# Patient Record
Sex: Female | Born: 1985 | Race: Black or African American | Hispanic: No | Marital: Single | Smoking: Current every day smoker
Health system: Southern US, Community
[De-identification: ages and names within clinical notes are randomized; demographics above are authoritative.]

## PROBLEM LIST (undated history)

## (undated) ENCOUNTER — Ambulatory Visit

## (undated) DIAGNOSIS — R7303 Prediabetes: Secondary | ICD-10-CM

## (undated) DIAGNOSIS — F17200 Nicotine dependence, unspecified, uncomplicated: Secondary | ICD-10-CM

## (undated) DIAGNOSIS — J310 Chronic rhinitis: Secondary | ICD-10-CM

## (undated) DIAGNOSIS — J45909 Unspecified asthma, uncomplicated: Secondary | ICD-10-CM

## (undated) DIAGNOSIS — L309 Dermatitis, unspecified: Secondary | ICD-10-CM

## (undated) DIAGNOSIS — G8929 Other chronic pain: Secondary | ICD-10-CM

## (undated) DIAGNOSIS — F419 Anxiety disorder, unspecified: Secondary | ICD-10-CM

## (undated) DIAGNOSIS — G43909 Migraine, unspecified, not intractable, without status migrainosus: Secondary | ICD-10-CM

## (undated) DIAGNOSIS — M1990 Unspecified osteoarthritis, unspecified site: Secondary | ICD-10-CM

## (undated) DIAGNOSIS — Z6841 Body Mass Index (BMI) 40.0 and over, adult: Secondary | ICD-10-CM

## (undated) DIAGNOSIS — R0683 Snoring: Secondary | ICD-10-CM

## (undated) HISTORY — DX: Unspecified asthma, uncomplicated: J45.909

## (undated) HISTORY — DX: Other chronic pain: G89.29

## (undated) HISTORY — DX: Prediabetes: R73.03

## (undated) HISTORY — PX: TYMPANOSTOMY TUBE PLACEMENT: SHX32

## (undated) HISTORY — DX: Anxiety disorder, unspecified: F41.9

## (undated) HISTORY — DX: Body Mass Index (BMI) 40.0 and over, adult: Z684

## (undated) HISTORY — DX: Nicotine dependence, unspecified, uncomplicated: F17.200

## (undated) HISTORY — DX: Snoring: R06.83

## (undated) HISTORY — DX: Chronic rhinitis: J31.0

## (undated) HISTORY — DX: Dermatitis, unspecified: L30.9

## (undated) HISTORY — DX: Migraine, unspecified, not intractable, without status migrainosus: G43.909

---
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
## 2007-09-01 ENCOUNTER — Emergency Department (HOSPITAL_COMMUNITY): Admission: EM | Admit: 2007-09-01 | Discharge: 2007-09-01 | Payer: Self-pay | Admitting: Emergency Medicine

## 2008-10-01 ENCOUNTER — Emergency Department (HOSPITAL_COMMUNITY): Admission: EM | Admit: 2008-10-01 | Discharge: 2008-10-01 | Payer: Self-pay | Admitting: Emergency Medicine

## 2008-10-20 ENCOUNTER — Emergency Department (HOSPITAL_COMMUNITY): Admission: EM | Admit: 2008-10-20 | Discharge: 2008-10-20 | Payer: Self-pay | Admitting: Emergency Medicine

## 2008-11-20 ENCOUNTER — Ambulatory Visit (HOSPITAL_COMMUNITY): Admission: RE | Admit: 2008-11-20 | Discharge: 2008-11-20 | Payer: Self-pay | Admitting: Oncology

## 2009-04-24 ENCOUNTER — Emergency Department (HOSPITAL_COMMUNITY): Admission: EM | Admit: 2009-04-24 | Discharge: 2009-04-24 | Payer: Self-pay | Admitting: Emergency Medicine

## 2009-04-28 ENCOUNTER — Emergency Department (HOSPITAL_COMMUNITY): Admission: EM | Admit: 2009-04-28 | Discharge: 2009-04-28 | Payer: Self-pay | Admitting: Emergency Medicine

## 2009-11-17 ENCOUNTER — Emergency Department (HOSPITAL_COMMUNITY): Admission: EM | Admit: 2009-11-17 | Discharge: 2009-11-17 | Payer: Self-pay | Admitting: Family Medicine

## 2010-02-10 ENCOUNTER — Encounter: Payer: Self-pay | Admitting: Oncology

## 2010-04-20 ENCOUNTER — Emergency Department (HOSPITAL_COMMUNITY)
Admission: EM | Admit: 2010-04-20 | Discharge: 2010-04-20 | Disposition: A | Discharge status: Home / Self Care | Payer: Self-pay | Attending: Emergency Medicine | Admitting: Emergency Medicine

## 2010-04-20 DIAGNOSIS — J069 Acute upper respiratory infection, unspecified: Secondary | ICD-10-CM | POA: Insufficient documentation

## 2010-04-20 DIAGNOSIS — J45909 Unspecified asthma, uncomplicated: Secondary | ICD-10-CM | POA: Insufficient documentation

## 2010-04-20 DIAGNOSIS — R0602 Shortness of breath: Secondary | ICD-10-CM | POA: Insufficient documentation

## 2010-04-20 DIAGNOSIS — J3489 Other specified disorders of nose and nasal sinuses: Secondary | ICD-10-CM | POA: Insufficient documentation

## 2010-04-20 DIAGNOSIS — R6889 Other general symptoms and signs: Secondary | ICD-10-CM | POA: Insufficient documentation

## 2010-11-17 ENCOUNTER — Emergency Department (HOSPITAL_COMMUNITY)
Admission: EM | Admit: 2010-11-17 | Discharge: 2010-11-17 | Discharge status: Against Medical Advice | Payer: Self-pay | Attending: Emergency Medicine | Admitting: Emergency Medicine

## 2010-11-17 DIAGNOSIS — M79609 Pain in unspecified limb: Secondary | ICD-10-CM | POA: Insufficient documentation

## 2010-11-17 DIAGNOSIS — R209 Unspecified disturbances of skin sensation: Secondary | ICD-10-CM | POA: Insufficient documentation

## 2010-11-23 ENCOUNTER — Inpatient Hospital Stay (INDEPENDENT_AMBULATORY_CARE_PROVIDER_SITE_OTHER)
Admission: RE | Admit: 2010-11-23 | Discharge: 2010-11-23 | Disposition: A | Discharge status: Home / Self Care | Payer: 59 | Source: Ambulatory Visit | Attending: Family Medicine | Admitting: Family Medicine

## 2010-11-23 DIAGNOSIS — M79609 Pain in unspecified limb: Secondary | ICD-10-CM

## 2011-03-01 ENCOUNTER — Emergency Department (HOSPITAL_COMMUNITY)
Admission: EM | Admit: 2011-03-01 | Discharge: 2011-03-02 | Disposition: A | Discharge status: Home / Self Care | Payer: 59 | Attending: Emergency Medicine | Admitting: Emergency Medicine

## 2011-03-01 ENCOUNTER — Emergency Department (HOSPITAL_COMMUNITY): Payer: 59

## 2011-03-01 ENCOUNTER — Encounter (HOSPITAL_COMMUNITY): Payer: Self-pay

## 2011-03-01 DIAGNOSIS — F17200 Nicotine dependence, unspecified, uncomplicated: Secondary | ICD-10-CM | POA: Insufficient documentation

## 2011-03-01 DIAGNOSIS — R05 Cough: Secondary | ICD-10-CM | POA: Insufficient documentation

## 2011-03-01 DIAGNOSIS — J45909 Unspecified asthma, uncomplicated: Secondary | ICD-10-CM | POA: Insufficient documentation

## 2011-03-01 DIAGNOSIS — J3489 Other specified disorders of nose and nasal sinuses: Secondary | ICD-10-CM | POA: Insufficient documentation

## 2011-03-01 DIAGNOSIS — R5381 Other malaise: Secondary | ICD-10-CM | POA: Insufficient documentation

## 2011-03-01 DIAGNOSIS — R509 Fever, unspecified: Secondary | ICD-10-CM | POA: Insufficient documentation

## 2011-03-01 DIAGNOSIS — R059 Cough, unspecified: Secondary | ICD-10-CM | POA: Insufficient documentation

## 2011-03-01 HISTORY — DX: Unspecified osteoarthritis, unspecified site: M19.90

## 2011-03-01 MED ORDER — IPRATROPIUM BROMIDE 0.02 % IN SOLN
0.5000 mg | Freq: Once | RESPIRATORY_TRACT | Status: AC
  Administered 2011-03-02: 0.5 mg via RESPIRATORY_TRACT
  Filled 2011-03-01: qty 2.5

## 2011-03-01 MED ORDER — ALBUTEROL SULFATE (5 MG/ML) 0.5% IN NEBU
5.0000 mg | INHALATION_SOLUTION | Freq: Once | RESPIRATORY_TRACT | Status: AC
  Administered 2011-03-02: 5 mg via RESPIRATORY_TRACT
  Filled 2011-03-01: qty 1

## 2011-03-01 NOTE — ED Provider Notes (Signed)
History     CSN: [id number]  Arrival date & time 03/01/11  2154   First MD Initiated Contact with Patient 03/01/11 2228      Chief Complaint  Patient presents with  . Fever    HPI  History provided by the patient. Patient is a 26 year old female has history of asthma presents with complaints of cough, wheezing and nasal congestion for the past few days. She reports having been on albuterol inhaler in the past but has not used this for a long time. Symptoms began gradually and have worsened. They're described as mild to moderate. Cough is nonproductive. Symptoms are associated with some subjective fevers and chills. She has not reported any alleviating or aggravating factors. She denies any sweats, nausea, vomiting, diarrhea or constipation. Patient is otherwise healthy.    Past Medical History  Diagnosis Date  . Arthritis     History reviewed. No pertinent past surgical history.  No family history on file.  History  Substance Use Topics  . Smoking status: Current Everyday Smoker  . Smokeless tobacco: Not on file  . Alcohol Use: No    OB History    Grav Para Term Preterm Abortions TAB SAB Ect Mult Living                  Review of Systems  Constitutional: Positive for fever and chills. Negative for diaphoresis, activity change and appetite change.  HENT: Positive for congestion and rhinorrhea.   Respiratory: Positive for cough and wheezing. Negative for shortness of breath.   Cardiovascular: Negative for chest pain.  Gastrointestinal: Negative for nausea, vomiting, abdominal pain, diarrhea and constipation.    Allergies  Review of patient's allergies indicates no known allergies.  Home Medications  No current outpatient prescriptions on file.  BP 112/88  Pulse 102  Temp(Src) 98.5 F (36.9 C) (Oral)  Resp 18  SpO2 98%  LMP 02/21/2011  Physical Exam  Nursing note and vitals reviewed. Constitutional: She is oriented to person, place, and time. She appears  well-developed and well-nourished. No distress.  HENT:  Head: Normocephalic and atraumatic.  Mouth/Throat: Oropharynx is clear and moist.  Cardiovascular: Normal rate and regular rhythm.   Pulmonary/Chest: Effort normal. No respiratory distress. She has wheezes. She has no rales. She exhibits no tenderness.  Abdominal: Soft. She exhibits no distension. There is no tenderness. There is no rebound.       Obese  Neurological: She is alert and oriented to person, place, and time.  Skin: Skin is warm and dry. No rash noted.  Psychiatric: She has a normal mood and affect. Her behavior is normal.    ED Course  Procedures   Dg Chest 2 View  03/01/2011  *RADIOLOGY REPORT*  Clinical Data: Shortness of breath. Cough, cold. Weakness.  History of smoking, asthma.  CHEST - 2 VIEW  Comparison: 04/24/2009  Findings: Cardiomediastinal silhouette is within normal limits. The lungs are free of focal consolidations and pleural effusions. Visualized osseous structures have a normal appearance.  IMPRESSION: No evidence for acute  abnormality.  Original Report Authenticated By: Patterson Hammersmith, M.D.     1. Asthma       MDM  10:15 PM patient seen and evaluated. Patient in no acute distress.    She reports feeling better from breathing treatment. Chest x-ray with no significant findings. We'll provide patient with albuterol inhaler for her symptoms.   Medical screening examination/treatment/procedure(s) were performed by non-physician practitioner and as supervising physician I was immediately available  for consultation/collaboration. Osvaldo Human, M.D.   Angus Seller, PA 03/02/11 0515  Carleene Cooper III, MD 03/02/11 [phone]

## 2011-03-01 NOTE — ED Notes (Signed)
Pt states she has been having a productive cough with green mucous since yesterday.  Stated she as running a fever yesterday but couldn't tell me what her temperature was.  Pt states she has been having difficulty breathing and does use an inhaler at home and does not have one at home.

## 2011-03-01 NOTE — ED Notes (Signed)
Pt c/o possible wheezing in chest - does not have asthma meds but is suppose to be taking albuterol

## 2011-03-01 NOTE — ED Notes (Signed)
Pt c/o ear pain, nose stopped up, and SOB

## 2011-03-02 MED ORDER — ALBUTEROL SULFATE HFA 108 (90 BASE) MCG/ACT IN AERS
2.0000 | INHALATION_SPRAY | RESPIRATORY_TRACT | Status: DC | PRN
  Administered 2011-03-02: 2 via RESPIRATORY_TRACT
  Filled 2011-03-02: qty 6.7

## 2011-12-13 ENCOUNTER — Emergency Department (INDEPENDENT_AMBULATORY_CARE_PROVIDER_SITE_OTHER)
Admission: EM | Admit: 2011-12-13 | Discharge: 2011-12-13 | Disposition: A | Discharge status: Home / Self Care | Payer: 59 | Source: Home / Self Care

## 2011-12-13 ENCOUNTER — Encounter (HOSPITAL_COMMUNITY): Payer: Self-pay | Admitting: Emergency Medicine

## 2011-12-13 DIAGNOSIS — J069 Acute upper respiratory infection, unspecified: Secondary | ICD-10-CM

## 2011-12-13 DIAGNOSIS — H6590 Unspecified nonsuppurative otitis media, unspecified ear: Secondary | ICD-10-CM

## 2011-12-13 HISTORY — DX: Unspecified asthma, uncomplicated: J45.909

## 2011-12-13 MED ORDER — AMOXICILLIN 500 MG PO CAPS: 500.0000 mg | ORAL_CAPSULE | Freq: Two times a day (BID) | ORAL | Status: DC

## 2011-12-13 NOTE — ED Notes (Signed)
Reports fever and sore throat for five days.  Reports she felt as if she was going to pass out at work on Tuesday.  Patient having chills and sweat.  Patient has tried many OTC medications but no relief.  Patient reports she does have left ear pain for five days also.  Patient states it started as a cold at first.

## 2011-12-13 NOTE — ED Provider Notes (Signed)
Karen Jensen  CC:  Sore throat, chills, ear pains  HPI:  Pt is a 26 y.o. with 5 days chills/fever, sore throat started next day. Runny nose originally now congested. Had cold 3 weeks ago. Cough still there, not coughing much up. Wheezing. Has been taking cough drops, cough medicine, allergy medicine (allavert?). Has asthma, using inhaler once in a while. Used last night.   Past Medical History  Diagnosis Date  . Asthma    PSH:  None  SOC:  Smokes 5-6 cig a day. Occ EtOH. Marijuana - stopped recently.  FAM:  None known  MEDS: none except OTC listed above  NKDA  PHYS EXAM Filed Vitals:   12/13/11 0935  BP: 112/67  Pulse: 114  Temp: 98.5 F (36.9 C)  Resp: 16   GEN:  Obese, NAD HEENT:  NCAT, conjunctiva muddy, EOMI, PERRL EARS:  Left TM erythema, bubbles, bulging, landmarks obscured but light reflex present, some sclerosis.  Right:  Sclerosis, light reflex present, landmarks obscured with bulging TM.  OP:  Red, no exudate Neck:  Minor ant cerv adenopathy CV:  RRR, no murmurs RESP:  CTAB Skin:  WWP  LABS:  Rapid Strep negative  A/P:  URI with sinusitis/otitis media Amoxicillin Supportive care - decongestants, mucinex, sinus rinse  Napoleon Form, MD 12/13/2011 10:04 AM     Napoleon Form, MD 12/13/11 1004

## 2011-12-22 ENCOUNTER — Emergency Department (HOSPITAL_COMMUNITY)
Admission: EM | Admit: 2011-12-22 | Discharge: 2011-12-22 | Disposition: A | Discharge status: Home / Self Care | Payer: 59 | Attending: Emergency Medicine | Admitting: Emergency Medicine

## 2011-12-22 ENCOUNTER — Encounter (HOSPITAL_COMMUNITY): Payer: Self-pay | Admitting: Emergency Medicine

## 2011-12-22 DIAGNOSIS — R1310 Dysphagia, unspecified: Secondary | ICD-10-CM | POA: Insufficient documentation

## 2011-12-22 DIAGNOSIS — J3489 Other specified disorders of nose and nasal sinuses: Secondary | ICD-10-CM | POA: Insufficient documentation

## 2011-12-22 DIAGNOSIS — H9209 Otalgia, unspecified ear: Secondary | ICD-10-CM | POA: Insufficient documentation

## 2011-12-22 DIAGNOSIS — J069 Acute upper respiratory infection, unspecified: Secondary | ICD-10-CM

## 2011-12-22 DIAGNOSIS — J029 Acute pharyngitis, unspecified: Secondary | ICD-10-CM

## 2011-12-22 DIAGNOSIS — Z8739 Personal history of other diseases of the musculoskeletal system and connective tissue: Secondary | ICD-10-CM | POA: Insufficient documentation

## 2011-12-22 DIAGNOSIS — R5381 Other malaise: Secondary | ICD-10-CM | POA: Insufficient documentation

## 2011-12-22 DIAGNOSIS — R599 Enlarged lymph nodes, unspecified: Secondary | ICD-10-CM | POA: Insufficient documentation

## 2011-12-22 DIAGNOSIS — R05 Cough: Secondary | ICD-10-CM | POA: Insufficient documentation

## 2011-12-22 DIAGNOSIS — F17200 Nicotine dependence, unspecified, uncomplicated: Secondary | ICD-10-CM | POA: Insufficient documentation

## 2011-12-22 DIAGNOSIS — R059 Cough, unspecified: Secondary | ICD-10-CM | POA: Insufficient documentation

## 2011-12-22 DIAGNOSIS — Z3202 Encounter for pregnancy test, result negative: Secondary | ICD-10-CM | POA: Insufficient documentation

## 2011-12-22 DIAGNOSIS — N760 Acute vaginitis: Secondary | ICD-10-CM

## 2011-12-22 LAB — URINALYSIS, ROUTINE W REFLEX MICROSCOPIC
Bilirubin Urine: NEGATIVE
Glucose, UA: NEGATIVE mg/dL
Ketones, ur: NEGATIVE mg/dL
Leukocytes, UA: NEGATIVE
Protein, ur: NEGATIVE mg/dL
Specific Gravity, Urine: 1.012 (ref 1.005–1.030)
Urobilinogen, UA: 1 mg/dL (ref 0.0–1.0)
pH: 5.5 (ref 5.0–8.0)

## 2011-12-22 LAB — POCT PREGNANCY, URINE: Preg Test, Ur: NEGATIVE

## 2011-12-22 MED ORDER — HYDROCOD POLST-CHLORPHEN POLST 10-8 MG/5ML PO LQCR: 5.0000 mL | Freq: Two times a day (BID) | ORAL | Status: DC

## 2011-12-22 MED ORDER — FLUCONAZOLE 200 MG PO TABS: 200.0000 mg | ORAL_TABLET | Freq: Every day | ORAL | Status: DC

## 2011-12-22 MED ORDER — PREDNISONE 20 MG PO TABS: ORAL_TABLET | ORAL | Status: DC

## 2011-12-22 MED ORDER — AZITHROMYCIN 250 MG PO TABS: ORAL_TABLET | ORAL | Status: DC

## 2011-12-22 NOTE — ED Notes (Signed)
Patient reports having been seen at urgent care for URI. Patient states today is last dose of Amoxicillin, patient states symptoms overall have worsened, although mucous has cleared. Patient also states she thinks she may have a yeast infection. Patient states increased symptoms, with difficulty swallowing and difficulty sleeping is what brings her in this morning.

## 2011-12-22 NOTE — ED Provider Notes (Signed)
History     CSN: [id number]  Arrival date & time 12/22/11  0608   First MD Initiated Contact with Patient 12/22/11 [phone]      Chief Complaint  Patient presents with  . Sore Throat  . Vaginitis    (Consider location/radiation/quality/duration/timing/severity/associated sxs/prior treatment) HPI Comments: Karen Jensen 26 y.o. female   The chief complaint is: Patient presents with:   Sore Throat   Vaginitis    26 year old female presents to emergency department with chief complaint of sore throat and vaginitis.  Patient states that she was seen at an urgent care on November 24.  She had a negative rapid strep at that time.  She was given amoxicillin and has taken her medication as prescribed.  She states that about 3 days into the course of her antibiotic she began having intense vaginal itching.  Patient also has sore throat with  associated tender cervical lymphadenopathy, otalgia bilaterally, sinus congestion, odynophagia.  She denies shortness of breath or difficulty breathing. Denies fevers, chills, myalgias, arthralgias. Denies DOE, SOB, chest tightness or pressure, radiation to left arm, jaw or back, or diaphoresis. Denies dysuria, flank pain, suprapubic pain, frequency, urgency, or hematuria. Denies headaches, light headedness, weakness, visual disturbances. Denies abdominal pain, nausea, vomiting, diarrhea or constipation.     Patient is a 26 y.o. female presenting with pharyngitis. The history is provided by the patient. No language interpreter was used.  Sore Throat The current episode started 1 to 4 weeks ago. The problem occurs constantly. The problem has been unchanged. Associated symptoms include congestion, coughing, fatigue, a sore throat and swollen glands. Pertinent negatives include no abdominal pain, anorexia, arthralgias, change in bowel habit, chest pain, chills, diaphoresis, fever, headaches, joint swelling, myalgias, nausea, neck pain, numbness, rash,  urinary symptoms, vertigo, visual change, vomiting or weakness. Associated symptoms comments: Vaginal pruritus .    Past Medical History  Diagnosis Date  . Arthritis     History reviewed. No pertinent past surgical history.  History reviewed. No pertinent family history.  History  Substance Use Topics  . Smoking status: Current Every Day Smoker  . Smokeless tobacco: Not on file  . Alcohol Use: No    OB History    Grav Para Term Preterm Abortions TAB SAB Ect Mult Living                  Review of Systems  Constitutional: Positive for fatigue. Negative for fever, chills and diaphoresis.  HENT: Positive for ear pain, congestion and sore throat. Negative for hearing loss, neck pain, neck stiffness, tinnitus and ear discharge.   Respiratory: Positive for cough.   Cardiovascular: Negative for chest pain.  Gastrointestinal: Negative for nausea, vomiting, abdominal pain, anorexia and change in bowel habit.  Musculoskeletal: Negative for myalgias, joint swelling and arthralgias.  Skin: Negative for rash.  Neurological: Negative for vertigo, weakness, numbness and headaches.  All other systems reviewed and are negative.  All else negative except as stated in HPI.   Allergies  Review of patient's allergies indicates no known allergies.  Home Medications   Current Outpatient Rx  Name  Route  Sig  Dispense  Refill  . AMOXICILLIN 500 MG PO CAPS   Oral   Take 500 mg by mouth 2 (two) times daily.           BP 115/87  Pulse 100  Temp 98.1 F (36.7 C) (Oral)  Resp 20  Ht 5\' 1"  (1.549 m)  Wt 195 lb (88.451 kg)  BMI  36.84 kg/m2  SpO2 100%  LMP 11/17/2011  Physical Exam Physical Exam  Nursing note and vitals reviewed. Constitutional: She is oriented to person, place, and time. She appears well-developed and well-nourished. No distress.  HENT:  Head: Normocephalic and atraumatic.  Eyes: Conjunctivae normal and EOM are normal. Pupils are equal, round, and reactive to  light. No scleral icterus.  Neck: Normal range of motion.  Tender Anterior/Tonsilar cervical Lymphadenopathy. Mouth/throat: Pharyngeal erythema present.  Uvula is midline.  Airway is patent.  Exudates are present on the uvula and tonsils.  Cobblestoning of the posterior pharynx consistent with postnasal drip.  There is some tonsillar and uvular swelling present. Ears: Scarring on the inferior aspect of the TMs bilaterally.  Both have retraction of the membrane.  No fluid levels present. Cardiovascular: Normal rate, regular rhythm and normal heart sounds.  Exam reveals no gallop and no friction rub.   No murmur heard. Pulmonary/Chest: Effort normal and breath sounds normal. No respiratory distress.  Abdominal: Soft. Bowel sounds are normal. She exhibits no distension and no mass. There is no tenderness. There is no guarding.  Neurological: She is alert and oriented to person, place, and time.  Skin: Skin is warm and dry. She is not diaphoretic.    ED Course  Procedures (including critical care time)  Labs Reviewed  URINALYSIS, ROUTINE W REFLEX MICROSCOPIC - Abnormal; Notable for the following:    APPearance CLOUDY (*)     All other components within normal limits  POCT PREGNANCY, URINE   No results found.   1. URI (upper respiratory infection)   2. Pharyngitis   3. Vaginitis       MDM  . Filed Vitals:   12/22/11 0617 12/22/11 0722  BP: 115/87 107/73  Pulse: 100 89  Temp: 98.1 F (36.7 C)   TempSrc: Oral   Resp: 20 18  Height: 5\' 1"  (1.549 m)   Weight: 195 lb (88.451 kg)   SpO2: 100% 100%   Patient with otalgia and retraction that likley reflects viral process, however given length of illness and I will change her abx,  Vaginits is described as intensely pruritic with thick curd-like dc consistent with yeast. Patient is low risk for STI as she is FSF and has not had sex in several months. D/c with zpack and diflucan plus sxs relief.  Discussed return precautions and need  for pelvic exam if sxs should not resolve. Discussed reasons to seek immediate care. Patient expresses understanding and agrees with plan.         Arthor Captain, PA-C 12/24/11 [phone]

## 2011-12-22 NOTE — ED Notes (Signed)
Patient was seen 11/21 for URI, sore throat, and left ear pain.  Patient now c/o bilateral ear pain, sore throat, and yeast infection.  Patient denies fevers.  Patient is on Amoxicillin PO and is on her last day.

## 2011-12-24 NOTE — ED Provider Notes (Signed)
Medical screening examination/treatment/procedure(s) were performed by non-physician practitioner and as supervising physician I was immediately available for consultation/collaboration.  Derwood Kaplan, MD 12/24/11 [phone]

## 2012-07-05 ENCOUNTER — Encounter (HOSPITAL_COMMUNITY): Payer: Self-pay | Admitting: Emergency Medicine

## 2012-07-05 ENCOUNTER — Emergency Department (INDEPENDENT_AMBULATORY_CARE_PROVIDER_SITE_OTHER)
Admission: EM | Admit: 2012-07-05 | Discharge: 2012-07-05 | Disposition: A | Discharge status: Home / Self Care | Payer: 59 | Source: Home / Self Care

## 2012-07-05 DIAGNOSIS — L259 Unspecified contact dermatitis, unspecified cause: Secondary | ICD-10-CM

## 2012-07-05 DIAGNOSIS — G5603 Carpal tunnel syndrome, bilateral upper limbs: Secondary | ICD-10-CM

## 2012-07-05 DIAGNOSIS — G5600 Carpal tunnel syndrome, unspecified upper limb: Secondary | ICD-10-CM

## 2012-07-05 DIAGNOSIS — L309 Dermatitis, unspecified: Secondary | ICD-10-CM

## 2012-07-05 MED ORDER — DICLOFENAC POTASSIUM 50 MG PO TABS: 50.0000 mg | ORAL_TABLET | Freq: Three times a day (TID) | ORAL | Status: DC

## 2012-07-05 MED ORDER — TRIAMCINOLONE ACETONIDE 0.1 % EX CREA: TOPICAL_CREAM | Freq: Two times a day (BID) | CUTANEOUS | Status: DC

## 2012-07-05 NOTE — ED Notes (Signed)
Pt c/o eczema of right ring finger onset 1 yr and numbness of right hand and left hand onset today sxs include: intermittent sharp pains that radiate to right shoulder... Hx of carpal tunnel of right hand Works at [employer] and constantly using hands... Denies: inj/trauma, fevers... She is alert and oriented w/no signs of acute distress.

## 2012-07-05 NOTE — ED Provider Notes (Signed)
History     CSN: [id number]  Arrival date & time 07/05/12  1232   None     Chief Complaint  Patient presents with  . Eczema  . Hand Pain    (Consider location/radiation/quality/duration/timing/severity/associated sxs/prior treatment) HPI Comments: 27 year old female presents with complaints of pain in the wrist and hands with numbness and diminished use. She states she was diagnosed with carpal tunnel syndrome several months ago and was given a wrist splint. She lost the wrist splint. 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 Is also complaining of rash on the right long finger and describes it as recurrent eczema. Hydrocortisone help some.   Past Medical History  Diagnosis Date  . Asthma     History reviewed. No pertinent past surgical history.  No family history on file.  History  Substance Use Topics  . Smoking status: Current Every Day Smoker -- 0.50 packs/day    Types: Cigarettes  . Smokeless tobacco: Not on file  . Alcohol Use: Yes    OB History   Grav Para Term Preterm Abortions TAB SAB Ect Mult Living                  Review of Systems  Constitutional: Negative for fever, chills and activity change.  HENT: Negative.   Respiratory: Negative.   Cardiovascular: Negative.   Musculoskeletal:       As per HPI  Skin: Negative for color change, pallor and rash.  Neurological: Positive for numbness.    Allergies  Review of patient's allergies indicates no known allergies.  Home Medications   Current Outpatient Rx  Name  Route  Sig  Dispense  Refill  . albuterol (PROVENTIL HFA;VENTOLIN HFA) 108 (90 BASE) MCG/ACT inhaler   Inhalation   Inhale 2 puffs into the lungs every 6 (six) hours as needed.         Marland Kitchen amoxicillin (AMOXIL) 500 MG capsule   Oral   Take 1 capsule (500 mg total) by mouth 2 (two) times daily.   20 capsule   2   .  diclofenac (CATAFLAM) 50 MG tablet   Oral   Take 1 tablet (50 mg total) by mouth 3 (three) times daily.   25 tablet   0   . triamcinolone cream (KENALOG) 0.1 %   Topical   Apply topically 2 (two) times daily. Apply for 2 weeks. May use on face   30 g   0     BP 118/89  Pulse 84  Temp(Src) 98.4 F (36.9 C) (Oral)  Resp 16  SpO2 100%  LMP 05/20/2012  Physical Exam  Nursing note and vitals reviewed. Constitutional: She is oriented to person, place, and time. She appears well-developed and well-nourished. No distress.  HENT:  Head: Normocephalic and atraumatic.  Eyes: EOM are normal.  Neck: Normal range of motion. Neck supple.  Musculoskeletal:  Some tenderness in the right hand and digits. Positive Phalen's and positive Tinel's. Complaining of numbness primarily to the areas distributed by the median nerve bilaterally. She is able to grasp and flex and extend the digits but with some discomfort in slowness of movement. Radial pulses 2+. Distal warmth is intact. Capillary refill is brisk.  Lymphadenopathy:    She has no cervical adenopathy.  Neurological: She is alert and oriented to person, place, and time. No cranial nerve deficit. She exhibits normal muscle tone.  Skin: Skin is warm and dry.  Psychiatric: She has a normal mood and affect.    ED Course  Procedures (including critical care time)  Labs Reviewed - No data to display No results found.   1. Carpal tunnel syndrome, bilateral   2. Eczema of hand       MDM  The patient his been doing the same activity since her initial visit for carpal tunnel syndrome. Her wrists and hands are getting worse with the symptoms listed above. Treat with the wrist splints to wear at all times particularly at work. Cataflam 50 mg every 8 hours when necessary pain, take with food Must followup with the orthopedic surgeon as this is getting worse. Call for appointment.        Hayden Rasmussen, NP 07/05/12 1427

## 2012-07-05 NOTE — ED Provider Notes (Signed)
Medical screening examination/treatment/procedure(s) were performed by non-physician practitioner and as supervising physician I was immediately available for consultation/collaboration.  Leslee Home, M.D.  Reuben Likes, MD 07/05/12 [phone]

## 2012-11-24 ENCOUNTER — Emergency Department (HOSPITAL_COMMUNITY)
Admission: EM | Admit: 2012-11-24 | Discharge: 2012-11-24 | Disposition: A | Discharge status: Home / Self Care | Payer: 59 | Attending: Emergency Medicine | Admitting: Emergency Medicine

## 2012-11-24 ENCOUNTER — Encounter (HOSPITAL_COMMUNITY): Payer: Self-pay | Admitting: Emergency Medicine

## 2012-11-24 DIAGNOSIS — J019 Acute sinusitis, unspecified: Secondary | ICD-10-CM | POA: Insufficient documentation

## 2012-11-24 DIAGNOSIS — Z79899 Other long term (current) drug therapy: Secondary | ICD-10-CM | POA: Insufficient documentation

## 2012-11-24 DIAGNOSIS — H9209 Otalgia, unspecified ear: Secondary | ICD-10-CM | POA: Insufficient documentation

## 2012-11-24 DIAGNOSIS — J329 Chronic sinusitis, unspecified: Secondary | ICD-10-CM

## 2012-11-24 DIAGNOSIS — F17200 Nicotine dependence, unspecified, uncomplicated: Secondary | ICD-10-CM | POA: Insufficient documentation

## 2012-11-24 DIAGNOSIS — K0889 Other specified disorders of teeth and supporting structures: Secondary | ICD-10-CM

## 2012-11-24 MED ORDER — AMOXICILLIN 500 MG PO CAPS: 500.0000 mg | ORAL_CAPSULE | Freq: Three times a day (TID) | ORAL | Status: DC

## 2012-11-24 MED ORDER — PREDNISONE 20 MG PO TABS: 40.0000 mg | ORAL_TABLET | Freq: Every day | ORAL | Status: DC

## 2012-11-24 MED ORDER — FLUTICASONE PROPIONATE 50 MCG/ACT NA SUSP: 2.0000 | Freq: Every day | NASAL | Status: DC

## 2012-11-24 MED ORDER — FLUCONAZOLE 200 MG PO TABS: 200.0000 mg | ORAL_TABLET | Freq: Once | ORAL | Status: DC

## 2012-11-24 MED ORDER — HYDROCODONE-ACETAMINOPHEN 5-325 MG PO TABS: ORAL_TABLET | ORAL | Status: DC

## 2012-11-24 MED ORDER — ALBUTEROL SULFATE HFA 108 (90 BASE) MCG/ACT IN AERS: 1.0000 | INHALATION_SPRAY | RESPIRATORY_TRACT | Status: DC | PRN

## 2012-11-24 MED ORDER — LORAZEPAM 2 MG/ML IJ SOLN: 2.0000 mg | Freq: Once | INTRAMUSCULAR | Status: DC

## 2012-11-24 NOTE — ED Provider Notes (Signed)
CSN: [id number]     Arrival date & time 11/24/12  1030 History   First MD Initiated Contact with Patient 11/24/12 1040     Chief Complaint  Patient presents with  . Dental Pain   (Consider location/radiation/quality/duration/timing/severity/associated sxs/prior Treatment) HPI  Karen Jensen is a 27 y.o. female past medical history significant for asthma, no history of intubations or hospitalizations, complaining of left upper tooth pain associated with left ear pain, sinus pressure and congestion worsening over the course of 3 weeks. Patient has been using Anbesol at home with little relief. Patient denies fever, nausea vomiting, cough, shortness of breath. Patient is requesting inhaler because when she is sick she states her asthma is exacerbated.  Past Medical History  Diagnosis Date  . Arthritis    History reviewed. No pertinent past surgical history. No family history on file. History  Substance Use Topics  . Smoking status: Current Every Day Smoker -- 0.25 packs/day    Types: Cigarettes  . Smokeless tobacco: Not on file  . Alcohol Use: No   OB History   Grav Para Term Preterm Abortions TAB SAB Ect Mult Living                 Review of Systems 10 systems reviewed and found to be negative, except as noted in the HPI   Allergies  Review of patient's allergies indicates no known allergies.  Home Medications   Current Outpatient Rx  Name  Route  Sig  Dispense  Refill  . benzocaine (HURRICAINE) 20 % GEL   Mouth/Throat   Use as directed 1 application in the mouth or throat every 30 (thirty) minutes as needed (pain).         Marland Kitchen gabapentin (NEURONTIN) 300 MG capsule   Oral   Take 300 mg by mouth 3 (three) times daily as needed (carpol tunnel pain).         Marland Kitchen ibuprofen (ADVIL,MOTRIN) 200 MG tablet   Oral   Take 1,600 mg by mouth every 6 (six) hours as needed for moderate pain.         . pseudoephedrine-guaifenesin (TUSSIN PE) 30-100 MG/5ML SYRP   Oral   Take  5 mLs by mouth every 4 (four) hours as needed (cough).          BP 154/104  Pulse 81  Temp(Src) 98.2 F (36.8 C) (Oral)  Resp 18  SpO2 100% Physical Exam  Nursing note and vitals reviewed. Constitutional: She is oriented to person, place, and time. She appears well-developed and well-nourished. No distress.  HENT:  Head: Normocephalic.  Mouth/Throat: Oropharynx is clear and moist.  Patient has severe tenderness to palpation of bilateral maxillary and frontal sinuses, nasal turbinates are erythematous and swollen, posterior pharynx is mildly injected. Bilateral tympanic membranes with scarring from prior tube placement, slightly bulging.   Generally good dentition, no gingival swelling, erythema or tenderness to palpation. Patient is handling their secretions. There is no tenderness to palpation or firmness underneath tongue bilaterally. No trismus.    Eyes: Conjunctivae and EOM are normal. Pupils are equal, round, and reactive to light.  Neck: Normal range of motion. Neck supple.  Cardiovascular: Normal rate, regular rhythm and intact distal pulses.   Pulmonary/Chest: Effort normal and breath sounds normal. No stridor. No respiratory distress. She has no wheezes. She has no rales. She exhibits no tenderness.  Abdominal: Soft. Bowel sounds are normal. She exhibits no distension and no mass. There is no tenderness. There is no rebound and no  guarding.  Musculoskeletal: Normal range of motion.  Neurological: She is alert and oriented to person, place, and time.  Psychiatric: She has a normal mood and affect.    ED Course  Procedures (including critical care time) Labs Review Labs Reviewed - No data to display Imaging Review No results found.  EKG Interpretation   None       MDM  No diagnosis found.   Filed Vitals:   11/24/12 1053  BP: 154/104  Pulse: 81  Temp: 98.2 F (36.8 C)  TempSrc: Oral  Resp: 18  SpO2: 100%     Karen Jensen is a 27 y.o. female with 3  weeks of worsening right-sided maxillary pain. The pain is likely coming from a sinusitis as the dental exam shows no abnormalities. Patient will be treated with 10 days of amoxicillin, encouraged to use fluticasone and nasal saline, I will ask her to follow with a dentist for a full evaluation. Patient has requested albuterol inhaler and yeast infection treatment which she states she always gets when she has any antibiotics. I have encouraged her to complete the antibiotic course of stay well-hydrated. Return precautions were discussed.  Pt is hemodynamically stable, appropriate for, and amenable to discharge at this time. Pt verbalized understanding and agrees with care plan. All questions answered. Outpatient follow-up and specific return precautions discussed.    New Prescriptions   ALBUTEROL (PROVENTIL HFA;VENTOLIN HFA) 108 (90 BASE) MCG/ACT INHALER    Inhale 1-2 puffs into the lungs every 4 (four) hours as needed for wheezing or shortness of breath.   AMOXICILLIN (AMOXIL) 500 MG CAPSULE    Take 1 capsule (500 mg total) by mouth 3 (three) times daily.   FLUCONAZOLE (DIFLUCAN) 200 MG TABLET    Take 1 tablet (200 mg total) by mouth once.   FLUTICASONE (FLONASE) 50 MCG/ACT NASAL SPRAY    Place 2 sprays into both nostrils daily.   HYDROCODONE-ACETAMINOPHEN (NORCO/VICODIN) 5-325 MG PER TABLET    Take 1-2 tablets by mouth every 6 hours as needed for pain.   PREDNISONE (DELTASONE) 20 MG TABLET    Take 2 tablets (40 mg total) by mouth daily.    Note: Portions of this report may have been transcribed using voice recognition software. Every effort was made to ensure accuracy; however, inadvertent computerized transcription errors may be present      Wynetta Emery, PA-C 11/24/12 1116

## 2012-11-24 NOTE — ED Notes (Addendum)
Per pt has had tooth pain x3 wks to "left upper wisdom tooth." Pt states pain has worsened and now experiencing left ear pain.

## 2012-11-24 NOTE — ED Provider Notes (Signed)
Medical screening examination/treatment/procedure(s) were performed by non-physician practitioner and as supervising physician I was immediately available for consultation/collaboration.  EKG Interpretation   None         Junius Argyle, MD 11/24/12 1610

## 2013-07-10 ENCOUNTER — Ambulatory Visit (INDEPENDENT_AMBULATORY_CARE_PROVIDER_SITE_OTHER): Payer: BC Managed Care – PPO | Admitting: Emergency Medicine

## 2013-07-10 VITALS — BP 110/66 | HR 88 | Temp 98.2°F | Resp 20 | Ht 60.0 in | Wt 199.8 lb

## 2013-07-10 DIAGNOSIS — G5601 Carpal tunnel syndrome, right upper limb: Secondary | ICD-10-CM

## 2013-07-10 DIAGNOSIS — G5600 Carpal tunnel syndrome, unspecified upper limb: Secondary | ICD-10-CM

## 2013-07-10 MED ORDER — NAPROXEN SODIUM 550 MG PO TABS: 550.0000 mg | ORAL_TABLET | Freq: Two times a day (BID) | ORAL | Status: DC

## 2013-07-10 NOTE — Progress Notes (Signed)
[facility] [address], [place] [address] [phone]  Date:  07/10/2013   Name:  Karen SaleQuenyatta Beason   DOB:  05/19/1985   MRN:  [id number]  PCP:  No PCP Per Patient    Chief Complaint: Hand Pain   History of Present Illness:  Karen SaleQuenyatta Neu is a 28 y.o. very pleasant female patient who presents with the following:  History of carpal tunnel syndrome in right hand.  Wore a splint at night but lost it in a move.  Was treated previously with medication but did not improve. Has numbness in median nerve distribution.  No improvement with over the counter medications or other home remedies. Denies other complaint or health concern today.   There are no active problems to display for this patient.   Past Medical History  Diagnosis Date  . Arthritis     No past surgical history on file.  History  Substance Use Topics  . Smoking status: Current Every Day Smoker -- 0.25 packs/day    Types: Cigarettes  . Smokeless tobacco: Not on file  . Alcohol Use: No    No family history on file.  No Known Allergies  Medication list has been reviewed and updated.  Current Outpatient Prescriptions on File Prior to Visit  Medication Sig Dispense Refill  . albuterol (PROVENTIL HFA;VENTOLIN HFA) 108 (90 BASE) MCG/ACT inhaler Inhale 1-2 puffs into the lungs every 4 (four) hours as needed for wheezing or shortness of breath.  1 Inhaler  3  . benzocaine (HURRICAINE) 20 % GEL Use as directed 1 application in the mouth or throat every 30 (thirty) minutes as needed (pain).      . fluticasone (FLONASE) 50 MCG/ACT nasal spray Place 2 sprays into both nostrils daily.  16 g  0  . gabapentin (NEURONTIN) 300 MG capsule Take 300 mg by mouth 3 (three) times daily as needed (carpol tunnel pain).      Marland Kitchen. HYDROcodone-acetaminophen (NORCO/VICODIN) 5-325 MG per tablet Take 1-2 tablets by mouth every 6 hours as needed for pain.  15 tablet  0  . ibuprofen (ADVIL,MOTRIN) 200 MG tablet Take  1,600 mg by mouth every 6 (six) hours as needed for moderate pain.      . pseudoephedrine-guaifenesin (TUSSIN PE) 30-100 MG/5ML SYRP Take 5 mLs by mouth every 4 (four) hours as needed (cough).       No current facility-administered medications on file prior to visit.    Review of Systems:  As per HPI, otherwise negative.    Physical Examination: Filed Vitals:   07/10/13 1512  BP: 110/66  Pulse: 88  Temp: 98.2 F (36.8 C)  Resp: 20   Filed Vitals:   07/10/13 1512  Height: 5' (1.524 m)  Weight: 199 lb 12.8 oz (90.629 kg)   Body mass index is 39.02 kg/(m^2). Ideal Body Weight: Weight in (lb) to have BMI = 25: 127.7   GEN: WDWN, NAD, Non-toxic, Alert & Oriented x 3 HEENT: Atraumatic, Normocephalic.  Ears and Nose: No external deformity. EXTR: No clubbing/cyanosis/edema NEURO: Normal gait.  PSYCH: Normally interactive. Conversant. Not depressed or anxious appearing.  Calm demeanor.  RIGHT hand Positive phalen and tinnel's   No thenar wasting.    Assessment and Plan: Carpal tunnel syndrome right hand  Signed,  Phillips OdorJeffery Anderson, MD

## 2013-07-10 NOTE — Patient Instructions (Signed)
Carpal Tunnel Syndrome The carpal tunnel is a narrow area located on the palm side of your wrist. The tunnel is formed by the wrist bones and ligaments. Nerves, blood vessels, and tendons pass through the carpal tunnel. Repeated wrist motion or certain diseases may cause swelling within the tunnel. This swelling pinches the main nerve in the wrist (median nerve) and causes the painful hand and arm condition called carpal tunnel syndrome. CAUSES   Repeated wrist motions.  Wrist injuries.  Certain diseases like arthritis, diabetes, alcoholism, hyperthyroidism, and kidney failure.  Obesity.  Pregnancy. SYMPTOMS   A "pins and needles" feeling in your fingers or hand.  Tingling or numbness in your fingers or hand.  An aching feeling in your entire arm.  Wrist pain that goes up your arm to your shoulder.  Pain that goes down into your palm or fingers.  A weak feeling in your hands. DIAGNOSIS  Your caregiver will take your history and perform a physical exam. An electromyography test may be needed. This test measures electrical signals sent out by the muscles. The electrical signals are usually slowed by carpal tunnel syndrome. You may also need X-rays. TREATMENT  Carpal tunnel syndrome may clear up by itself. Your caregiver may recommend a wrist splint or medicine such as a nonsteroidal anti-inflammatory medicine. Cortisone injections may help. Sometimes, surgery may be needed to free the pinched nerve.  HOME CARE INSTRUCTIONS   Take all medicine as directed by your caregiver. Only take over-the-counter or prescription medicines for pain, discomfort, or fever as directed by your caregiver.  If you were given a splint to keep your wrist from bending, wear it as directed. It is important to wear the splint at night. Wear the splint for as long as you have pain or numbness in your hand, arm, or wrist. This may take 1 to 2 months.  Rest your wrist from any activity that may be causing your  pain. If your symptoms are work-related, you may need to talk to your employer about changing to a job that does not require using your wrist.  Put ice on your wrist after long periods of wrist activity.  Put ice in a plastic bag.  Place a towel between your skin and the bag.  Leave the ice on for 15-20 minutes, 03-04 times a day.  Keep all follow-up visits as directed by your caregiver. This includes any orthopedic referrals, physical therapy, and rehabilitation. Any delay in getting necessary care could result in a delay or failure of your condition to heal. SEEK IMMEDIATE MEDICAL CARE IF:   You have new, unexplained symptoms.  Your symptoms get worse and are not helped or controlled with medicines. MAKE SURE YOU:   Understand these instructions.  Will watch your condition.  Will get help right away if you are not doing well or get worse. Document Released: 01/04/2000 Document Revised: 03/31/2011 Document Reviewed: 11/22/2010 ExitCare Patient Information 2015 [employer]. This information is not intended to replace advice given to you by your health care provider. Make sure you discuss any questions you have with your health care provider.  

## 2013-08-07 ENCOUNTER — Ambulatory Visit (INDEPENDENT_AMBULATORY_CARE_PROVIDER_SITE_OTHER): Payer: BC Managed Care – PPO | Admitting: Family Medicine

## 2013-08-07 VITALS — BP 110/72 | HR 91 | Temp 99.1°F | Resp 18 | Ht 60.0 in | Wt 196.0 lb

## 2013-08-07 DIAGNOSIS — F17200 Nicotine dependence, unspecified, uncomplicated: Secondary | ICD-10-CM

## 2013-08-07 DIAGNOSIS — L209 Atopic dermatitis, unspecified: Secondary | ICD-10-CM

## 2013-08-07 DIAGNOSIS — J4540 Moderate persistent asthma, uncomplicated: Secondary | ICD-10-CM

## 2013-08-07 DIAGNOSIS — L2089 Other atopic dermatitis: Secondary | ICD-10-CM

## 2013-08-07 DIAGNOSIS — J45909 Unspecified asthma, uncomplicated: Secondary | ICD-10-CM

## 2013-08-07 DIAGNOSIS — Z720 Tobacco use: Secondary | ICD-10-CM

## 2013-08-07 DIAGNOSIS — R21 Rash and other nonspecific skin eruption: Secondary | ICD-10-CM

## 2013-08-07 MED ORDER — TRIAMCINOLONE ACETONIDE 0.1 % EX CREA: 1.0000 "application " | TOPICAL_CREAM | Freq: Two times a day (BID) | CUTANEOUS | Status: DC

## 2013-08-07 MED ORDER — BECLOMETHASONE DIPROPIONATE 80 MCG/ACT IN AERS: 1.0000 | INHALATION_SPRAY | Freq: Two times a day (BID) | RESPIRATORY_TRACT | Status: DC

## 2013-08-07 MED ORDER — ALBUTEROL SULFATE HFA 108 (90 BASE) MCG/ACT IN AERS
1.0000 | INHALATION_SPRAY | RESPIRATORY_TRACT | Status: DC | PRN
Start: 2013-08-07 — End: 2014-01-24

## 2013-08-07 NOTE — Patient Instructions (Addendum)
Smoking cessation is very important in treating your asthma.  Work on other coping techniques/stress management techniques to lessen need for cigarettes. [place] offers smoking cessation clinics. Registration is required. To register call [phone] or register online at [email or web address].  Start Qvar for asthma, and albuterol if needed.  Recheck in 3 months. Return to the clinic or go to the nearest emergency room if any of your symptoms worsen or new symptoms occur.  For eczema - triamcinolone cream twice per day as needed, and Eucerin or Aquaphor lotion twice per day to affected areas.   We will call you to schedule appointment with primary care provider.

## 2013-08-07 NOTE — Progress Notes (Signed)
Subjective:    Patient ID: Karen Jensen, female    DOB: May 21, 1985, 28 y.o.   MRN: [id number]  HPI Karen Jensen is a 28 y.o. female  Hx of asthma.  No PCP or primary care provider at this time. Prior on inhaled steroids and pills in the past when asthma flair. Last ER visit about 2 years ago. Taking albuterol only now. 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  Nighttime wakening with asthma sx's - once 2 weeks ago.   No recent fever. Slight cold symptoms 2 weeks ago - better now.   Missed work today as more wheezing today. Worked about 6 hours, used inhaler twice, still some trouble breathing - left work.   Eczema - used steroid cream in past, unknown name. Used cocoa butter. Notes on R finger now, but on arms at times.   - using over the counter hydrocortisone without relief.   Tobacco abuse - smoking less - 6 cigarettes per day. Cut back from pack and a half per day.   There are no active problems to display for this patient.  Past Medical History  Diagnosis Date  . Arthritis    History reviewed. No pertinent past surgical history. No Known Allergies Prior to Admission medications   Medication Sig Start Date End Date Taking? Authorizing Provider  albuterol (PROVENTIL HFA;VENTOLIN HFA) 108 (90 BASE) MCG/ACT inhaler Inhale 1-2 puffs into the lungs every 4 (four) hours as needed for wheezing or shortness of breath. 11/24/12  Yes Nicole Pisciotta, PA-C  benzocaine (HURRICAINE) 20 % GEL Use as directed 1 application in the mouth or throat every 30 (thirty) minutes as needed (pain).    Historical Provider, MD  fluticasone (FLONASE) 50 MCG/ACT nasal spray Place 2 sprays into both nostrils daily. 11/24/12   Nicole Pisciotta, PA-C  gabapentin (NEURONTIN) 300 MG capsule Take 300 mg by mouth 3  (three) times daily as needed (carpol tunnel pain).    Historical Provider, MD  HYDROcodone-acetaminophen (NORCO/VICODIN) 5-325 MG per tablet Take 1-2 tablets by mouth every 6 hours as needed for pain. 11/24/12   Nicole Pisciotta, PA-C  ibuprofen (ADVIL,MOTRIN) 200 MG tablet Take 1,600 mg by mouth every 6 (six) hours as needed for moderate pain.    Historical Provider, MD  naproxen sodium (ANAPROX DS) 550 MG tablet Take 1 tablet (550 mg total) by mouth 2 (two) times daily with a meal. 07/10/13 07/10/14  Phillips Odor, MD  pseudoephedrine-guaifenesin (TUSSIN PE) 30-100 MG/5ML SYRP Take 5 mLs by mouth every 4 (four) hours as needed (cough).    Historical Provider, MD   History   Social History  . Marital Status: Single    Spouse Name: N/A    Number of Children: N/A  . Years of Education: N/A   Occupational History  . Not on file.   Social History Main Topics  . Smoking status: Current Every Day Smoker -- 0.25 packs/day    Types: Cigarettes  . Smokeless tobacco: Not on file  . Alcohol Use: No  . Drug Use: Yes    Special: Marijuana  . Sexual Activity: Not on file   Other Topics Concern  . Not on file   Social History Narrative  . No narrative on file       Review  of Systems  Constitutional: Negative for fever and chills.  HENT: Positive for congestion (as above. ) and rhinorrhea.   Respiratory: Positive for cough, shortness of breath and wheezing.   Cardiovascular: Negative for leg swelling.  Skin: Positive for rash (eczema - hand. ).       Objective:   Physical Exam  Vitals reviewed. Constitutional: She is oriented to person, place, and time. She appears well-developed and well-nourished. No distress.  HENT:  Head: Normocephalic and atraumatic.  Right Ear: Hearing, tympanic membrane, external ear and ear canal normal.  Left Ear: Hearing, tympanic membrane, external ear and ear canal normal.  Nose: Nose normal.  Mouth/Throat: Oropharynx is clear and moist. No  oropharyngeal exudate.  Eyes: Conjunctivae and EOM are normal. Pupils are equal, round, and reactive to light.  Cardiovascular: Normal rate, regular rhythm, normal heart sounds and intact distal pulses.   No murmur heard. Pulmonary/Chest: Effort normal and breath sounds normal. No respiratory distress. She has no wheezes. She has no rhonchi. She has no rales. She exhibits no tenderness.  Neurological: She is alert and oriented to person, place, and time.  Skin: Skin is warm and dry. Rash noted.     Psychiatric: She has a normal mood and affect. Her behavior is normal.   . Filed Vitals:   08/07/13 1310  BP: 110/72  Pulse: 91  Temp: 99.1 F (37.3 C)  TempSrc: Oral  Resp: 18  Height: 5' (1.524 m)  Weight: 196 lb (88.905 kg)  SpO2: 100%    Peak flow 350, predicted 413. (84%predicted).     Assessment & Plan:   Karen Jensen is a 28 y.o. female Asthma, moderate persistent, uncomplicated - Plan: beclomethasone (QVAR) 80 MCG/ACT inhaler, albuterol (PROVENTIL HFA;VENTOLIN HFA) 108 (90 BASE) MCG/ACT inhaler  - decreased control - will add Qvar BID, continue albuterol prn. recheck in next 3 months.   Atopic dermatitis - Plan: triamcinolone cream (KENALOG) 0.1 %, Rash of hands - Plan: triamcinolone cream (KENALOG) 0.1 %  - Otc Eucerin or aquaphor, TAC BID prn to affected area.   Tobacco abuse  - cessation and importance discussed, especially with asthma control.  Discussed coping techniques and if needed - number for cessation clinic.   Meds ordered this encounter  Medications  . beclomethasone (QVAR) 80 MCG/ACT inhaler    Sig: Inhale 1 puff into the lungs 2 (two) times daily.    Dispense:  1 Inhaler    Refill:  3  . albuterol (PROVENTIL HFA;VENTOLIN HFA) 108 (90 BASE) MCG/ACT inhaler    Sig: Inhale 1-2 puffs into the lungs every 4 (four) hours as needed for wheezing or shortness of breath.    Dispense:  1 Inhaler    Refill:  1  . triamcinolone cream (KENALOG) 0.1 %    Sig:  Apply 1 application topically 2 (two) times daily.    Dispense:  30 g    Refill:  3   Patient Instructions  Smoking cessation is very important in treating your asthma.  Work on other coping techniques/stress management techniques to lessen need for cigarettes. [place] offers smoking cessation clinics. Registration is required. To register call [phone] or register online at [email or web address].  Start Qvar for asthma, and albuterol if needed.  Recheck in 3 months. Return to the clinic or go to the nearest emergency room if any of your symptoms worsen or new symptoms occur.  For eczema - triamcinolone cream twice per day as needed, and Eucerin or Aquaphor lotion twice per day  to affected areas.   Schedule appointment with primary care provider.

## 2013-08-08 NOTE — Progress Notes (Signed)
7/20 left message for patient to call back to reschedule appointment.

## 2013-08-09 ENCOUNTER — Telehealth: Payer: Self-pay

## 2013-08-09 NOTE — Telephone Encounter (Signed)
Pt states an rx for an inhaler was called in to [facility] for him, pt was not able to get inhaler because it was too expensive Pt wants to know if some less expensive can be called in

## 2013-08-10 NOTE — Telephone Encounter (Signed)
Can we send in a medication for asthma maintenance for pt that is not as expensive as Qvar? No coupons available for this med.

## 2013-08-11 MED ORDER — MOMETASONE FURO-FORMOTEROL FUM 200-5 MCG/ACT IN AERO: 2.0000 | INHALATION_SPRAY | Freq: Every day | RESPIRATORY_TRACT | Status: DC

## 2013-08-11 NOTE — Telephone Encounter (Signed)
rx for dulera sent to pharmacy. There is coupon card and there is are samples in the cabinet if she would like to come by and pick up 1

## 2013-08-12 NOTE — Telephone Encounter (Signed)
VM not setup to leave message 

## 2013-08-13 ENCOUNTER — Ambulatory Visit (INDEPENDENT_AMBULATORY_CARE_PROVIDER_SITE_OTHER): Payer: BC Managed Care – PPO | Admitting: Family Medicine

## 2013-08-13 VITALS — BP 112/78 | HR 77 | Temp 98.2°F | Resp 16 | Ht 60.0 in | Wt 197.6 lb

## 2013-08-13 DIAGNOSIS — G5600 Carpal tunnel syndrome, unspecified upper limb: Secondary | ICD-10-CM

## 2013-08-13 DIAGNOSIS — G5603 Carpal tunnel syndrome, bilateral upper limbs: Secondary | ICD-10-CM

## 2013-08-13 MED ORDER — GABAPENTIN 300 MG PO CAPS: 300.0000 mg | ORAL_CAPSULE | Freq: Three times a day (TID) | ORAL | Status: DC | PRN

## 2013-08-13 MED ORDER — HYDROCODONE-ACETAMINOPHEN 5-325 MG PO TABS: ORAL_TABLET | ORAL | Status: DC

## 2013-08-13 NOTE — Progress Notes (Signed)
° °  Subjective:    Patient ID: Karen Jensen, female    DOB: 04/26/1985, 28 y.o.   MRN: [id number] This chart was scribed for Elvina SidleKurt Lauenstein, MD by Leona CarryG. Clay Sherrill, ED Scribe. The patient was seen in Room 11. The patient's care was started at 8:50 AM.    HPI HPI Comments: Karen Jensen is a 28 y.o. female with a history of carpal tunnel (approximately 1 year) who presents complaining of right wrist and hand pain. Patient reports associated numbness in fingertips. She states that she was diagnosed with carpal tunnel syndrome approximately 1 year ago. 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 She was seen last night and was prescribed Naproxen. She reports no relief of symptoms.   Patient reports that she is currently at student at [employer] and plans to transfer to [employer]. She is studying Actuaryelectrical engineering.   Patient does not have a PCP.     Review of Systems  Musculoskeletal: Positive for arthralgias (right wrist and hand).  Neurological: Positive for numbness (fingertips of right hand).       Objective:   Physical Exam  Nursing note and vitals reviewed. Constitutional: She is oriented to person, place, and time. She appears well-developed and well-nourished. No distress.  HENT:  Head: Normocephalic and atraumatic.  Eyes: Conjunctivae and EOM are normal.  Neck: Neck supple. No tracheal deviation present.  Cardiovascular: Normal rate.   Pulmonary/Chest: Effort normal. No respiratory distress.  Musculoskeletal: Normal range of motion. She exhibits tenderness (right volar wrist).  Neurological: She is alert and oriented to person, place, and time.  Skin: Skin is warm and dry.  Psychiatric: She has a normal mood and affect. Her behavior is normal.          Assessment & Plan:   This chart was scribed in my presence and reviewed by me  personally. This nice lady has bilateral carpal tunnel syndrome. It's gotten worse and she started working at [employer]. She's not going to be able to go back four-week while we try to calm this down.  Because she's had a problem so long I think it's useful to see an orthopedic specialist to see if surgery is indicated.  In the meantime we'll give her bilateral wrist splints, Neurontin, and time off from work.  Elvina SidleKurt Lauenstein, MD

## 2013-08-13 NOTE — Patient Instructions (Signed)
Carpal Tunnel Syndrome The carpal tunnel is a narrow area located on the palm side of your wrist. The tunnel is formed by the wrist bones and ligaments. Nerves, blood vessels, and tendons pass through the carpal tunnel. Repeated wrist motion or certain diseases may cause swelling within the tunnel. This swelling pinches the main nerve in the wrist (median nerve) and causes the painful hand and arm condition called carpal tunnel syndrome. CAUSES   Repeated wrist motions.  Wrist injuries.  Certain diseases like arthritis, diabetes, alcoholism, hyperthyroidism, and kidney failure.  Obesity.  Pregnancy. SYMPTOMS   A "pins and needles" feeling in your fingers or hand, especially in your thumb, index and middle fingers.  Tingling or numbness in your fingers or hand.  An aching feeling in your entire arm, especially when your wrist and elbow are bent for long periods of time.  Wrist pain that goes up your arm to your shoulder.  Pain that goes down into your palm or fingers.  A weak feeling in your hands. DIAGNOSIS  Your health care provider will take your history and perform a physical exam. An electromyography test may be needed. This test measures electrical signals sent out by your nerves into the muscles. The electrical signals are usually slowed by carpal tunnel syndrome. You may also need X-rays. TREATMENT  Carpal tunnel syndrome may clear up by itself. Your health care provider may recommend a wrist splint or medicine such as a nonsteroidal anti-inflammatory medicine. Cortisone injections may help. Sometimes, surgery may be needed to free the pinched nerve.  HOME CARE INSTRUCTIONS   Take all medicine as directed by your health care provider. Only take over-the-counter or prescription medicines for pain, discomfort, or fever as directed by your health care provider.  If you were given a splint to keep your wrist from bending, wear it as directed. It is important to wear the splint at  night. Wear the splint for as long as you have pain or numbness in your hand, arm, or wrist. This may take 1 to 2 months.  Rest your wrist from any activity that may be causing your pain. If your symptoms are work-related, you may need to talk to your employer about changing to a job that does not require using your wrist.  Put ice on your wrist after long periods of wrist activity.  Put ice in a plastic bag.  Place a towel between your skin and the bag.  Leave the ice on for 15-20 minutes, 03-04 times a day.  Keep all follow-up visits as directed by your health care provider. This includes any orthopedic referrals, physical therapy, and rehabilitation. Any delay in getting necessary care could result in a delay or failure of your condition to heal. SEEK IMMEDIATE MEDICAL CARE IF:   You have new, unexplained symptoms.  Your symptoms get worse and are not helped or controlled with medicines. MAKE SURE YOU:   Understand these instructions.  Will watch your condition.  Will get help right away if you are not doing well or get worse. Document Released: 01/04/2000 Document Revised: 05/23/2013 Document Reviewed: 11/22/2010 ExitCare Patient Information 2015 ExitCare[employer]. This information is not intended to replace advice given to you by your health care provider. Make sure you discuss any questions you have with your health care provider.  

## 2013-08-17 NOTE — Telephone Encounter (Signed)
Called pt at both numbers provided, mobile vm not set up, was not at home.

## 2013-08-18 ENCOUNTER — Encounter: Payer: Self-pay | Admitting: Family Medicine

## 2013-08-18 NOTE — Telephone Encounter (Signed)
Faxed updated work note to number provided

## 2013-08-18 NOTE — Telephone Encounter (Signed)
[employer] is requesting a note to return to work on Aug 3rd without restrictions.    Fax  [phone]

## 2013-08-30 NOTE — Progress Notes (Signed)
Left a message for patient to return call to schedule an appointment. 

## 2013-09-02 ENCOUNTER — Encounter: Payer: Self-pay | Admitting: Family Medicine

## 2013-09-09 ENCOUNTER — Other Ambulatory Visit: Payer: Self-pay | Admitting: Family Medicine

## 2013-09-09 ENCOUNTER — Telehealth: Payer: Self-pay

## 2013-09-09 DIAGNOSIS — G5603 Carpal tunnel syndrome, bilateral upper limbs: Secondary | ICD-10-CM

## 2013-09-09 NOTE — Telephone Encounter (Signed)
Patient needs a refill on hydrocodone. CB # [phone]

## 2013-09-10 MED ORDER — HYDROCODONE-ACETAMINOPHEN 5-325 MG PO TABS: ORAL_TABLET | ORAL | Status: DC

## 2013-09-12 NOTE — Telephone Encounter (Signed)
Unable to LM- Rx is ready and in the pick up drawer.

## 2013-09-16 ENCOUNTER — Telehealth: Payer: Self-pay | Admitting: Family Medicine

## 2013-09-16 NOTE — Telephone Encounter (Signed)
Message copied by Tilman Neat on Fri Sep 16, 2013 10:37 AM ------      Message from: [place], [place] R      Created: Sun Aug 07, 2013  2:05 PM       appt with any provider accepting new patients - 1st available.  ------

## 2013-09-16 NOTE — Telephone Encounter (Signed)
vm not available in 2 out of 3 numbers provided. Unable to leave message on 3rd number as person hung up on me,

## 2013-10-18 ENCOUNTER — Telehealth: Payer: Self-pay

## 2013-10-18 NOTE — Telephone Encounter (Signed)
Pt requesting refill for hydrochodone   Best phone for pt is [phone]

## 2013-10-18 NOTE — Telephone Encounter (Signed)
No.  He needs to go to orthopedics if the pain is that bad

## 2013-10-20 NOTE — Telephone Encounter (Signed)
Told patient that she needs to go to orthopedic

## 2014-01-24 ENCOUNTER — Ambulatory Visit (INDEPENDENT_AMBULATORY_CARE_PROVIDER_SITE_OTHER): Payer: BLUE CROSS/BLUE SHIELD | Admitting: Family Medicine

## 2014-01-24 VITALS — BP 122/80 | HR 78 | Temp 97.9°F | Resp 18 | Ht 60.5 in | Wt 194.0 lb

## 2014-01-24 DIAGNOSIS — K088 Other specified disorders of teeth and supporting structures: Secondary | ICD-10-CM

## 2014-01-24 DIAGNOSIS — J4540 Moderate persistent asthma, uncomplicated: Secondary | ICD-10-CM

## 2014-01-24 DIAGNOSIS — G5601 Carpal tunnel syndrome, right upper limb: Secondary | ICD-10-CM

## 2014-01-24 DIAGNOSIS — K0889 Other specified disorders of teeth and supporting structures: Secondary | ICD-10-CM

## 2014-01-24 DIAGNOSIS — J45909 Unspecified asthma, uncomplicated: Secondary | ICD-10-CM

## 2014-01-24 DIAGNOSIS — L309 Dermatitis, unspecified: Secondary | ICD-10-CM

## 2014-01-24 DIAGNOSIS — F17200 Nicotine dependence, unspecified, uncomplicated: Secondary | ICD-10-CM

## 2014-01-24 DIAGNOSIS — Z2821 Immunization not carried out because of patient refusal: Secondary | ICD-10-CM

## 2014-01-24 MED ORDER — MOMETASONE FURO-FORMOTEROL FUM 200-5 MCG/ACT IN AERO: 2.0000 | INHALATION_SPRAY | Freq: Every day | RESPIRATORY_TRACT | Status: DC

## 2014-01-24 MED ORDER — CLOBETASOL PROPIONATE 0.05 % EX CREA: 1.0000 "application " | TOPICAL_CREAM | Freq: Two times a day (BID) | CUTANEOUS | Status: DC

## 2014-01-24 MED ORDER — HYDROCODONE-ACETAMINOPHEN 5-325 MG PO TABS: 1.0000 | ORAL_TABLET | Freq: Three times a day (TID) | ORAL | Status: DC | PRN

## 2014-01-24 MED ORDER — ALBUTEROL SULFATE HFA 108 (90 BASE) MCG/ACT IN AERS: 1.0000 | INHALATION_SPRAY | RESPIRATORY_TRACT | Status: DC | PRN

## 2014-01-24 MED ORDER — GABAPENTIN 300 MG PO CAPS: 300.0000 mg | ORAL_CAPSULE | Freq: Three times a day (TID) | ORAL | Status: DC

## 2014-01-24 NOTE — Progress Notes (Signed)
Chief Complaint:  Chief Complaint  Patient presents with  . Dental Pain    Worse on left upper side of mouth. 2+ months. She does not have a regular dentist   . Medication Refill    Gabapentin (would like higher dosage), Albuterol, Dulera, Kenalog cream     HPI: Karen Jensen is a 29 y.o. female who is here for:  1. 2 month history of tooth pain in the back molars, she has tried oragel, and inbuprfen, and old school remedies where she put the scarf wrapped aound her head and did not have any relief. She ahs 7/10 pain, constant pain, worse when she eats, she can't eat, she tried to eat a burger but only eating 1-2 bites. No fevers or chills. She would not know sicne she has been taking the ibupfrofen, seh takes it every 12 hours , 8 pills each time.   2. She has asthma, she is smoking and she smokes cigs and marijuana, she has tried quitting but did not work. She works in [employer] and is around dust , she uses her dulera and uses her rescue inhaler.   3. She has eczema on right ring finger, she uses dove soap and it does not work well, she has flareups.   4. She has carpal tunnel, she has it on her right wrist/hand, would like higher does of gabapentin, currently taking 300 mg TID, ahs tried taking 600 mg and it helps.    Past Medical History  Diagnosis Date  . Asthma   . Arthritis   . Eczema    History reviewed. No pertinent past surgical history. History   Social History  . Marital Status: Single    Spouse Name: N/A    Number of Children: N/A  . Years of Education: N/A   Social History Main Topics  . Smoking status: Current Every Day Smoker -- 0.25 packs/day    Types: Cigarettes  . Smokeless tobacco: None  . Alcohol Use: No  . Drug Use: Yes    Special: Marijuana  . Sexual Activity: None   Other Topics Concern  . None   Social History Narrative   ** Merged History Encounter **       Family History  Problem Relation Age of Onset  . Heart  disease Father    No Known Allergies Prior to Admission medications   Medication Sig Start Date End Date Taking? Authorizing Provider  albuterol (PROVENTIL HFA;VENTOLIN HFA) 108 (90 BASE) MCG/ACT inhaler Inhale 1-2 puffs into the lungs every 4 (four) hours as needed for wheezing or shortness of breath. 08/07/13  Yes Shade FloodJeffrey R Greene, MD  gabapentin (NEURONTIN) 300 MG capsule TAKE 1 CAPSULE BY MOUTH THREE TIMES DAILY AS NEEDED 09/12/13  Yes Elvina SidleKurt Lauenstein, MD  mometasone-formoterol [facility](DULERA) 200-5 MCG/ACT AERO Inhale 2 puffs into the lungs daily. 08/11/13  Yes Heather M Marte, PA-C  triamcinolone cream (KENALOG) 0.1 % Apply 1 application topically 2 (two) times daily. 08/07/13  Yes Shade FloodJeffrey R Greene, MD  HYDROcodone-acetaminophen (NORCO/VICODIN) 5-325 MG per tablet qhs Patient not taking: Reported on 01/24/2014 09/10/13   Elvina SidleKurt Lauenstein, MD     ROS: The patient denies fevers, chills, night sweats, unintentional weight loss, chest pain, palpitations, wheezing, dyspnea on exertion, nausea, vomiting, abdominal pain, dysuria, hematuria, melena, numbness, weakness, or tingling.   All other systems have been reviewed and were otherwise negative with the exception of those mentioned in the HPI and as above.    PHYSICAL  EXAM: Filed Vitals:   01/24/14 1149  BP: 122/80  Pulse: 78  Temp: 97.9 F (36.6 C)  Resp: 18   Filed Vitals:   01/24/14 1149  Height: 5' 0.5" (1.537 m)  Weight: 194 lb (87.998 kg)   Body mass index is 37.25 kg/(m^2).  General: Alert, no acute distress HEENT:  Normocephalic, atraumatic, oropharynx patent. EOMI, PERRLA, + wisdom teeth budding bilaterally on upper gums, no e/o infection Cardiovascular:  Regular rate and rhythm, no rubs murmurs or gallops.  Radial pulse intact. No pedal edema.  Respiratory: Clear to auscultation bilaterally.  No wheezes, rales, or rhonchi.  No cyanosis, no use of accessory musculature GI: No organomegaly, abdomen is soft and non-tender, positive  bowel sounds.  No masses. Skin: + eczematous rashes. Neurologic: Facial musculature symmetric. Psychiatric: Patient is appropriate throughout our interaction. Lymphatic: No cervical lymphadenopathy Musculoskeletal: Gait intact. + tinels wrist right Full ROM, 5/5 strength,s esnationintact, neg phalens   LABS: Results for orders placed or performed during the hospital encounter of 12/22/11  Urinalysis, Routine w reflex microscopic  Result Value Ref Range   Color, Urine YELLOW YELLOW   APPearance CLOUDY (A) CLEAR   Specific Gravity, Urine 1.012 1.005 - 1.030   pH 5.5 5.0 - 8.0   Glucose, UA NEGATIVE NEGATIVE mg/dL   Hgb urine dipstick NEGATIVE NEGATIVE   Bilirubin Urine NEGATIVE NEGATIVE   Ketones, ur NEGATIVE NEGATIVE mg/dL   Protein, ur NEGATIVE NEGATIVE mg/dL   Urobilinogen, UA 1.0 0.0 - 1.0 mg/dL   Nitrite NEGATIVE NEGATIVE   Leukocytes, UA NEGATIVE NEGATIVE  Pregnancy, urine POC  Result Value Ref Range   Preg Test, Ur NEGATIVE NEGATIVE     EKG/XRAY:   Primary read interpreted by Dr. Conley Rolls at [facility].   ASSESSMENT/PLAN: Encounter Diagnoses  Name Primary?  . Pain, dental Yes  . Asthma, chronic, unspecified asthma severity, uncomplicated   . Tobacco dependence   . Carpal tunnel syndrome of right wrist   . Eczema of right hand   . Asthma, moderate persistent, uncomplicated   . Influenza vaccination declined    Gave info to go to dentist, rx norco Refilled Dulera and albuterol Refilled Gabapentin, she can take 2 caps before work in the Am, then 1 cap at lunch and 1 cap before bedtime prn  Change triamcinolone to clobetasol to see if more effective Decline to quit smoking at this time, she will consider it at some point. Advise to quit smoking period!  F/u prn    Gross sideeffects, risk and benefits, and alternatives of medications d/w patient. Patient is aware that all medications have potential sideeffects and we are unable to predict every sideeffect or drug-drug  interaction that may occur.  Hamilton Capri PHUONG, DO 01/25/2014 2:34 PM

## 2014-01-24 NOTE — Patient Instructions (Signed)
RESOURCE GUIDE ° °Chronic Pain Problems: °Contact [facility]  [phone] °Patients need to be referred by their primary care doctor. ° °Insufficient Money for Medicine: °Contact United Way:  call [phone] ° °No Primary Care Doctor: °- Call [facility]  [phone] - can help you locate a primary care doctor that  accepts your insurance, provides certain services, etc. °- Physician Referral Service- 1-800-533-3463 ° °Agencies that provide inexpensive medical care: °- [facility]  [phone] °- [facility]  [phone] °- [facility]  [phone] °- [facility]  [phone] °- [facility]  [phone] °- [facility]  [phone] ° °Medicaid-accepting [place] Providers: °- [facility]- [address] ° [phone], Mon-Fri 9am-7pm, Sat 9am-1pm °- [facility]- [address], [address] ° [phone] °- [facility]- [address] ° [phone] °- [facility]- [address] [place] [address] ° [phone] °- [facility]- [address], [phone] ° Only accepts  Access Medicaid patients after they have their name  applied to their card ° °Self Pay (no insurance) in [place]: °- Sickle Cell Patients - [facility] ° [address], [phone] °- [facility]- [address] ° [phone] °      -     [facility] [place]- [address] °      -     [facility]- see information above (Speak to Pam H if you do not have insurance) °      -  [facility] [place]- [address],  [phone] °      -  [facility]- [address] [place] [address], [phone] °      -  Dr Osei-Bonsu-  [address], [place], [phone] °      -  [facility] - [address], [phone] °      -  [facility]- [address] [place] [address], [phone], also  [address], [phone] °      -     [facility]- [address], [phone], 1st & 3rd Saturday °        every month, 10am-1pm ° -     [facility] °  [address], [place]. °  Phone:  [phone], Fax:  [phone]. Hours of Operation:  9 am - 6 pm, M-F. ° -     [facility] °  [address], [place] °  Phone: [phone], Fax: [phone]. Hours of Operation:  8:30 am - 5:30 pm, M-F. ° °[facility] °[address] °, [place] [address] °(336) [phone] ° °[facility] °[address] °[place], [place] [address] °(336) [phone] ° °1) Find a Doctor and Pay Out of Pocket °Although you won't have to find out who is covered by your insurance plan, it is a good idea to ask around and get recommendations. You will then need to call the office and see if the doctor you have chosen will accept you as a new patient and what types of options they offer for patients who are self-pay. Some doctors offer discounts or will set up payment plans for their patients who do not have insurance, but   you will need to ask so you aren't surprised when you get to your appointment. ° °2) Contact Your Local Health Department °Not all health departments have doctors that can see patients for sick visits, but many do, so it is worth a call to see if yours does. If you don't know where your local health department is, you can check in your phone book. The CDC also has a tool to help you locate your state's health department, and many state websites also have listings of all of their local health departments. ° °3) Find a Walk-in Clinic °If your illness is not likely to be very severe or complicated, you may want to try a walk in clinic. These are popping up all over the country in pharmacies, drugstores, and shopping centers. They're usually staffed by nurse practitioners or physician assistants that have been trained  to treat common illnesses and complaints. They're usually fairly quick and inexpensive. However, if you have serious medical issues or chronic medical problems, these are probably not your best option ° °STD Testing °- [place] [facility] [place], [facility], [address], [place], phone [phone] or [phone].  Monday - Friday, call for an appointment. °- [place] [facility] [place], [facility], [address], [place], phone [phone] or [phone].  Monday - Friday, call for an appointment. ° °Abuse/Neglect: °- [place] Child Abuse Hotline (336) 641-3795 °- [place] Child Abuse Hotline 800-378-5315 (After Hours) ° °Emergency Shelter:  [facility] (336) 271-5985 ° °Maternity Homes: °- [facility] (336) 275-9566 °- [facility] (704) 372-4663 ° °MRSA Hotline #:   832-7006 ° °Dental Assistance °If unable to pay or uninsured, contact:  [place] [facility] to become qualified for the adult dental clinic. ° °Patients with Medicaid: [facility] °[address], [phone] °[address], [phone] ° °If unable to pay, or uninsured, contact [place] [facility] ([phone] in [place], [phone] in [place]) to become qualified for the adult dental clinic ° °[facility] °[address] °[place], [place] [address] °(336) 272-4177 °www.[facility].com ° °Other Low-Cost Community Dental Services: °- [facility]- [address], [place], [place], [address], [phone], Ext. 123, 2nd and 4th Thursday of the month at 6:30am.  10 clients each day by appointment, can sometimes see walk-in patients if someone does not show for an appointment. °- [facility]- [address], [place], [place], [address], [phone] °- [facility]- [address], [place], [place], [address], [phone] °- [place]  [facility]- 342-8273 °- [facility]- [phone] °- [facility]- 570-6415 ° °     Behavioral Health Resources in the Community ° °Intensive Outpatient Programs: °[facility]      °[address] °[place], [place] °336-878-6098 °Both a day and evening program °      °[facility]     °[address]        °[place], [place] [address] °336-832-9800        ° °[facility] °[address] °[place] °336-882-2125 ° °[facility] °ACCESS LINE: 1-800-853-5163 or 336-641-4981 °[address] °[place], [place] [address] °Http://www.[email or web address].[email or web address]/adult.htm ° ° °Substance Abuse Resources: °- [facility]  336-882-2125 °- [facility] 336-784-9470 °- [facility] 336-285-9073 °- [facility] 336-845-3988 °-   Residential & Outpatient Substance Abuse Program  800-659-3381 ° °Psychological Services: °- [facility]  832-9600 °- [facility]  378-7881 °- [facility], [address], [place], ACCESS LINE: 1-800-853-5163 or 336-641-4981, Http://www.[facility].com/services/adult.htm ° °Mobile Crisis Teams:         °                               °[facility]         °Mobile Crisis Care Unit °1-877-626-1772       °      °Assertive °[facility] °[address] [employer] °336-834-9664 °                                        °Interventionist °Sharon DeEsch °[address] °[place] °336-554-5454 ° °Self-Help/Support Groups: °[facility] Variety of support groups °373-1402 (call for more info) ° °Narcotics Anonymous (NA) °[facility] °[address] °[place] - 2 meetings at this location ° °Residential Treatment Programs:  °[facility]      °[address]        °[place]       °866-801-8205        ° °[facility] °[address] °[place], [place]  [address] °704-293-8524 ° °[facility]  °[address] °[place], [place] [address] °336-845-3988 °Admissions: 8am-3pm M-F ° °[facility]     °[address]        °[place], [place] [address]       °336-841-1104        ° °[facility] °[address] °[place], [place] °336-379-7146 ° °[facility] °[address] °[place], [place] °336-285-9073 ° °[facility] - Intensive Outpatient      °[address]     °[place], [place]       °852-3033        ° °[facility]     °[address] °[place],  °877-615-2722 or 336-784-9470 ° °[facility] ([facility]), Medicaid °[address] °[place],  °336-227-7417 ° °[facility]                                               °[address] °[place]  °800-659-3381 ° °[place] [facility]: °[facility]- 1-888-581-9988              ° °General Therapy                                                °Julie Brannon, PhD        °[address]                                       °[place],  [address]         °336-349-5553   °Insurance ° °[facility]   °[address] °[place], [place] [address] °336-349-4454 ° °[facility] °[address] [place], [place] [address] °336-342-8316 °Insurance/Medicaid/sponsorship through [facility] ° °[facility]                                              °[address]                                        °[place], [place] [address]    °Therapy/tele-psych/case         °336-342-8316        °  °[facility] °[address]  ° [place], [place]  [address]  °Adolescent/group home/case management °336-349-2233  °                                         °Julia Brannon PhD       °General therapy       °Insurance   °336-951-0000        ° °Dr. Arfeen, Insurance, M-F °336- 349-4544 ° °[facility] [place]  [facility]  [employer] [place] [facility] °[address]                 [address]         [address]  °[place]                                               [place] °Phone:  [phone]                                  Phone:  [phone]                   Phone:  342-8140 ° °[facility], 342-8316 °- [place] [facility] - [facility]- 1-888-581-9988 °      -     [facility] in [place], [address], °            336-349-4454, Insurance ° °[place] Child Abuse Hotline °(336) 342-1394 or (336) 342-3537 (After Hours) ° ° °

## 2014-04-04 ENCOUNTER — Ambulatory Visit: Payer: BLUE CROSS/BLUE SHIELD

## 2014-04-05 ENCOUNTER — Ambulatory Visit (INDEPENDENT_AMBULATORY_CARE_PROVIDER_SITE_OTHER): Payer: BLUE CROSS/BLUE SHIELD | Admitting: Family Medicine

## 2014-04-05 VITALS — BP 118/66 | HR 88 | Temp 98.2°F | Resp 17 | Ht 60.5 in | Wt 194.0 lb

## 2014-04-05 DIAGNOSIS — J45909 Unspecified asthma, uncomplicated: Secondary | ICD-10-CM

## 2014-04-05 DIAGNOSIS — K029 Dental caries, unspecified: Secondary | ICD-10-CM | POA: Diagnosis not present

## 2014-04-05 DIAGNOSIS — K088 Other specified disorders of teeth and supporting structures: Secondary | ICD-10-CM | POA: Diagnosis not present

## 2014-04-05 DIAGNOSIS — F17200 Nicotine dependence, unspecified, uncomplicated: Secondary | ICD-10-CM | POA: Diagnosis not present

## 2014-04-05 DIAGNOSIS — H9201 Otalgia, right ear: Secondary | ICD-10-CM | POA: Diagnosis not present

## 2014-04-05 DIAGNOSIS — K0889 Other specified disorders of teeth and supporting structures: Secondary | ICD-10-CM

## 2014-04-05 MED ORDER — AMOXICILLIN 500 MG PO CAPS: 500.0000 mg | ORAL_CAPSULE | Freq: Two times a day (BID) | ORAL | Status: DC

## 2014-04-05 MED ORDER — HYDROCODONE-ACETAMINOPHEN 5-325 MG PO TABS: 1.0000 | ORAL_TABLET | Freq: Three times a day (TID) | ORAL | Status: DC | PRN

## 2014-04-05 MED ORDER — FLUCONAZOLE 150 MG PO TABS: 150.0000 mg | ORAL_TABLET | Freq: Once | ORAL | Status: DC

## 2014-04-05 NOTE — Progress Notes (Signed)
Chief Complaint:  Chief Complaint  Patient presents with  . Dental Pain    x1 mth   . Ear Pain    HPI: Karen Jensen is a 29 y.o. female who is here for  Right sided ear pain and dental pain, she has had tooth pain for 1 month and she has been taking only advil, a lot of ibuprofen and ear medicine otc for ht epain but di dnot work. She still works at [employer], she is cutting back on her smoking. She had some sensitivities to cold, hot foods and liquids. She has not been eating well because it hurts. She can't afford to go to the dentist at this time unless I'm able to find her some sliding 6 scale clinic.  Past Medical History  Diagnosis Date  . Asthma   . Arthritis   . Eczema    History reviewed. No pertinent past surgical history. History   Social History  . Marital Status: Single    Spouse Name: N/A  . Number of Children: N/A  . Years of Education: N/A   Social History Main Topics  . Smoking status: Current Every Day Smoker -- 0.25 packs/day    Types: Cigarettes  . Smokeless tobacco: Not on file  . Alcohol Use: No  . Drug Use: Yes    Special: Marijuana  . Sexual Activity: Not on file   Other Topics Concern  . None   Social History Narrative   ** Merged History Encounter **       Family History  Problem Relation Age of Onset  . Heart disease Father    No Known Allergies Prior to Admission medications   Medication Sig Start Date End Date Taking? Authorizing Provider  albuterol (PROVENTIL HFA;VENTOLIN HFA) 108 (90 BASE) MCG/ACT inhaler Inhale 1-2 puffs into the lungs every 4 (four) hours as needed for wheezing or shortness of breath. 01/24/14  Yes Immaculate Crutcher P Michoel Kunin, DO  clobetasol cream (TEMOVATE) 0.05 % Apply 1 application topically 2 (two) times daily. 01/24/14  Yes Saba Gomm P Kosta Schnitzler, DO  gabapentin (NEURONTIN) 300 MG capsule Take 1 capsule (300 mg total) by mouth 3 (three) times daily. 01/24/14  Yes Ronica Vivian P Verita Kuroda, DO  mometasone-formoterol (DULERA) 200-5  MCG/ACT AERO Inhale 2 puffs into the lungs daily. 01/24/14  Yes Bengie Kaucher P Karene Bracken, DO  HYDROcodone-acetaminophen (NORCO) 5-325 MG per tablet Take 1 tablet by mouth every 8 (eight) hours as needed for moderate pain or severe pain. Patient not taking: Reported on 04/05/2014 01/24/14   Betzaida Cremeens P Philis Doke, DO     ROS: The patient denies fevers, chills, night sweats, unintentional weight loss, chest pain, palpitations, wheezing, dyspnea on exertion, nausea, vomiting, abdominal pain, dysuria, hematuria, melena, numbness, weakness, or tingling.   All other systems have been reviewed and were otherwise negative with the exception of those mentioned in the HPI and as above.    PHYSICAL EXAM: Filed Vitals:   04/05/14 1437  BP: 118/66  Pulse: 88  Temp: 98.2 F (36.8 C)  Resp: 17   Filed Vitals:   04/05/14 1437  Height: 5' 0.5" (1.537 m)  Weight: 194 lb (87.998 kg)   Body mass index is 37.25 kg/(m^2).  General: Alert, no acute distress HEENT:  Normocephalic, atraumatic, oropharynx patent. EOMI, PERRLA Positive dental caries, questionable chipped tooth in the upper right molar TMs are within normal limits. Throat is normal. Cardiovascular:  Regular rate and rhythm, no rubs murmurs or gallops.  No Carotid  bruits, radial pulse intact. No pedal edema.  Respiratory: Clear to auscultation bilaterally.  No wheezes, rales, or rhonchi.  No cyanosis, no use of accessory musculature GI: No organomegaly, abdomen is soft and non-tender, positive bowel sounds.  No masses. Skin: No rashes. Neurologic: Facial musculature symmetric. Psychiatric: Patient is appropriate throughout our interaction. Lymphatic: No cervical lymphadenopathy Musculoskeletal: Gait intact.   LABS: Results for orders placed or performed during the hospital encounter of 12/22/11  Urinalysis, Routine w reflex microscopic  Result Value Ref Range   Color, Urine YELLOW YELLOW   APPearance CLOUDY (A) CLEAR   Specific Gravity, Urine 1.012 1.005 - 1.030    pH 5.5 5.0 - 8.0   Glucose, UA NEGATIVE NEGATIVE mg/dL   Hgb urine dipstick NEGATIVE NEGATIVE   Bilirubin Urine NEGATIVE NEGATIVE   Ketones, ur NEGATIVE NEGATIVE mg/dL   Protein, ur NEGATIVE NEGATIVE mg/dL   Urobilinogen, UA 1.0 0.0 - 1.0 mg/dL   Nitrite NEGATIVE NEGATIVE   Leukocytes, UA NEGATIVE NEGATIVE  Pregnancy, urine POC  Result Value Ref Range   Preg Test, Ur NEGATIVE NEGATIVE     EKG/XRAY:   Primary read interpreted by Dr. Conley Rolls at [facility].   ASSESSMENT/PLAN: Encounter Diagnoses  Name Primary?  . Pain, dental Yes  . Ear pain, right   . Dental caries   . Tobacco dependence   . Asthma, chronic, unspecified asthma severity, uncomplicated    [name] is a pleasant 29 year old African-American female with a chipped right upper molar, dental caries. She presents with acute tooth pain. She has tried different medications without relief. Prescribed amoxicillin Prescribed Norco Advised to follow-up with free or sliding scale dental clinic, referral information given Follow-up when necessary  Gross sideeffects, risk and benefits, and alternatives of medications d/w patient. Patient is aware that all medications have potential sideeffects and we are unable to predict every sideeffect or drug-drug interaction that may occur.  Hamilton Capri PHUONG, DO 04/05/2014 6:21 PM

## 2014-04-05 NOTE — Patient Instructions (Signed)
RESOURCE GUIDE ° °Chronic Pain Problems: °Contact [facility]  [phone] °Patients need to be referred by their primary care doctor. ° °Insufficient Money for Medicine: °Contact United Way:  call (888) 892-1162 ° °No Primary Care Doctor: °- Call [facility]  [phone] - can help you locate a primary care doctor that  accepts your insurance, provides certain services, etc. °- Physician Referral Service- 1-800-533-3463 ° °Agencies that provide inexpensive medical care: °- [facility]  [phone] °- [facility]  [phone] °- [facility]  [phone] °- [facility]  [phone] °- [facility]  [phone] °- [facility]  [phone] ° °Medicaid-accepting [place] Providers: °- [facility]- [address] ° [phone], Mon-Fri 9am-7pm, Sat 9am-1pm °- [facility]- [address], [address] ° [phone] °- [facility]- [address], [address] ° [phone] °- [facility]- [address] [place] [address] ° [phone] °- [facility]- [address], [phone] ° Only accepts [place] Access Medicaid patients after they have their name  applied to their card ° °Self Pay (no insurance) in [place]: °- Sickle Cell Patients - [facility] ° [address], [phone] °- [facility]- [address] ° [phone] °      -     [facility]- [address] °      -     [facility]- see information above (Speak to Pam H if you do not have insurance) °      -  [facility] [place]- [address],  [phone] °      -  [facility]- [address] [place] [address], [phone] °      -  Dr Osei-Bonsu-  [address], [place], [phone] °      -  [facility] - [address], [phone] °      -  [facility]- [address] [place] [address], [phone], also  [address], [phone] °      -     [facility]- [address], [phone], 1st & 3rd Saturday °        every month, 10am-1pm ° -     [facility] °  [address], [place]. °  Phone:  [phone], Fax:  [phone]. Hours of Operation:  9 am - 6 pm, M-F. ° -     [facility] °  [address], [address], [place] °  Phone: [phone], Fax: [phone]. Hours of Operation:  8:30 am - 5:30 pm, M-F. ° °[facility] °[address] °[place], [place] [address] °(336) 832-4777 ° °[facility] °[address] °[place], [place] [address] °(336) [phone] ° °1) Find a Doctor and Pay Out of Pocket °Although you won't have to find out who is covered by your insurance plan, it is a good idea to ask around and get recommendations. You will then need to call the office and see if the doctor you have chosen will accept you as a new patient and what types of options they offer for patients who are self-pay. Some doctors offer discounts or will set up payment plans for their patients who do not have insurance, but   you will need to ask so you aren't surprised when you get to your appointment. ° °2) Contact Your Local Health Department °Not all health departments have doctors that can see patients for sick visits, but many do, so it is worth a call to see if yours does. If you don't know where your local health department is, you can check in your phone book. The CDC also has a tool to help you locate your state's health department, and many state websites also have listings of all of their local health departments. ° °3) Find a Walk-in Clinic °If your illness is not likely to be very severe or complicated, you may want to try a walk in clinic. These are popping up all over the country in pharmacies, drugstores, and shopping centers. They're usually staffed by nurse practitioners or physician assistants that have been trained  to treat common illnesses and complaints. They're usually fairly quick and inexpensive. However, if you have serious medical issues or chronic medical problems, these are probably not your best option ° °STD Testing °- [place] [facility] [place], [address], [place], phone [phone] or [phone].  Monday - Friday, call for an appointment. °- [place] [place], [facility], [address], [place], phone [phone] or [phone].  Monday - Friday, call for an appointment. ° °Abuse/Neglect: °- [place] Child Abuse Hotline (336) 641-3795 °- [place] Child Abuse Hotline 800-378-5315 (After Hours) ° °Emergency Shelter:  [facility] (336) 271-5985 ° °Maternity Homes: °- [facility] (336) 275-9566 °- [facility] (704) 372-4663 ° °MRSA Hotline #:   832-7006 ° °Dental Assistance °If unable to pay or uninsured, contact:  [place] [facility] to become qualified for the adult dental clinic. ° °Patients with Medicaid: [facility] °[address], [phone] °[address], [phone] ° °If unable to pay, or uninsured, contact [place] [facility] ([phone] in [place], 842-7733 in [place]) to become qualified for the adult dental clinic ° °[facility] °[address] °[place], [place] [address] °(336) 272-4177 °www.[facility].com ° °Other Low-Cost Community Dental Services: °- [facility]- [address], [place], [place], [address], [phone], Ext. 123, 2nd and 4th Thursday of the month at 6:30am.  10 clients each day by appointment, can sometimes see walk-in patients if someone does not show for an appointment. °- [facility]- [address], [place], [place], [address], [phone] °- [facility]- [address], [place], [place], [address], [phone] °- [place]  [facility]- 342-8273 °- [facility]- 703-3100 °- [facility]- 570-6415 ° °     Behavioral Health Resources in the Community ° °Intensive Outpatient Programs: °[facility]      °[address] °[place], [place] °336-878-6098 °Both a day and evening program °      °[facility]     °[address]        °[place], [place] [address] °336-832-9800        ° °[facility] °[address] °[place] °336-882-2125 ° °[place] °ACCESS LINE: 1-800-853-5163 or 336-641-4981 °[address] °[place], [place] [address] °Http://www.[email or web address].[email or web address]/adult.htm ° ° °Substance Abuse Resources: °- [facility]  336-882-2125 °- [facility] 336-784-9470 °- [facility] 336-285-9073 °- [facility] 336-845-3988 °-   Residential & Outpatient Substance Abuse Program  800-659-3381 ° °Psychological Services: °- [facility]  832-9600 °- [facility]  378-7881 °- [facility], [address], [place], ACCESS LINE: 1-800-853-5163 or 336-641-4981, Http://www.[facility].com/services/adult.htm ° °Mobile Crisis Teams:         °                               °[facility]         °Mobile Crisis Care Unit °1-877-626-1772       °      °Assertive °[facility] °[address] [place] °336-834-9664 °                                        °Interventionist °Sharon DeEsch °[address] °[place] °336-554-5454 ° °Self-Help/Support Groups: °[facility] Variety of support groups °373-1402 (call for more info) ° °Narcotics Anonymous (NA) °[facility] °[address] °[place] - 2 meetings at this location ° °Residential Treatment Programs:  °[facility]      °[address]        °[place]       °866-801-8205        ° °[facility] °[address] °[place], [place]  [address] °704-293-8524 ° °[facility]  °[address] °[place], [place] [address] °336-845-3988 °Admissions: 8am-3pm M-F ° °[facility]     °[address]        °[place], [place] [address]       °336-841-1104        ° °[facility] °[address] °[place], [place] °336-379-7146 ° °[facility] °[address] °[place], [place] °336-285-9073 ° °[facility] - Intensive Outpatient      °[address]     °[place], [place]       °852-3033        ° °[facility]     °[address] °[place], [place] °877-615-2722 or 336-784-9470 ° °[facility] ([facility]), Medicaid °[address] °[place], [place] °336-227-7417 ° °[facility]                                               °[address] °[place] °800-659-3381 ° °[place] [facility]: °[facility]- 1-888-581-9988              ° °General Therapy                                                °Julie Brannon, PhD        °[address]                                       °[place],  [address]         °336-349-5553   °Insurance ° °[facility]   °[address] °[place], [place] [address] °336-349-4454 ° °[facility] °[address] [place], [place] [address] °336-342-8316 °Insurance/Medicaid/sponsorship through [facility] ° °[facility]                                              °[address]                                        °[place], [place] [address]    °Therapy/tele-psych/case         °336-342-8316        °  °[facility] °[address]  ° [place], [place]  [address]  °Adolescent/group home/case management °336-349-2233  °                                         °Julia Brannon PhD       °General therapy       °Insurance   °336-951-0000        ° °Dr. Arfeen, Insurance, M-F °336- 349-4544 ° °[facility] [place]  [facility]  [employer] [place] [facility] °[address]                 [address]         [address]  °[place]                                               [place]                              [place] °Phone:  349-3220                                  Phone:  [phone]                   Phone:  342-8140 ° °[facility], 342-8316 °- [place] [facility] - [facility]- 1-888-581-9988 °      -     [facility] in , [address], °            336-349-4454, Insurance ° °[place] Child Abuse Hotline °(336) 342-1394 or (336) 342-3537 (After Hours) ° ° °

## 2014-05-21 ENCOUNTER — Ambulatory Visit (INDEPENDENT_AMBULATORY_CARE_PROVIDER_SITE_OTHER): Payer: BLUE CROSS/BLUE SHIELD | Admitting: Family Medicine

## 2014-05-21 VITALS — BP 120/70 | HR 83 | Temp 98.8°F | Resp 20 | Ht 60.5 in | Wt 195.4 lb

## 2014-05-21 DIAGNOSIS — K088 Other specified disorders of teeth and supporting structures: Secondary | ICD-10-CM | POA: Diagnosis not present

## 2014-05-21 DIAGNOSIS — K0889 Other specified disorders of teeth and supporting structures: Secondary | ICD-10-CM

## 2014-05-21 DIAGNOSIS — K029 Dental caries, unspecified: Secondary | ICD-10-CM

## 2014-05-21 DIAGNOSIS — J0101 Acute recurrent maxillary sinusitis: Secondary | ICD-10-CM

## 2014-05-21 DIAGNOSIS — H9201 Otalgia, right ear: Secondary | ICD-10-CM | POA: Diagnosis not present

## 2014-05-21 MED ORDER — AMOXICILLIN-POT CLAVULANATE 875-125 MG PO TABS: 1.0000 | ORAL_TABLET | Freq: Two times a day (BID) | ORAL | Status: DC

## 2014-05-21 MED ORDER — BUDESONIDE 32 MCG/ACT NA SUSP: 2.0000 | Freq: Every day | NASAL | Status: DC

## 2014-05-21 MED ORDER — FEXOFENADINE HCL 180 MG PO TABS: 180.0000 mg | ORAL_TABLET | Freq: Every day | ORAL | Status: DC

## 2014-05-21 MED ORDER — FLUCONAZOLE 150 MG PO TABS
150.0000 mg | ORAL_TABLET | Freq: Once | ORAL | Status: DC
Start: 2014-05-21 — End: 2014-06-06

## 2014-05-21 MED ORDER — PREDNISONE 20 MG PO TABS: ORAL_TABLET | ORAL | Status: DC

## 2014-05-21 NOTE — Progress Notes (Signed)
Subjective:  This chart was scribed for Karen Sorenson, MD by [facility], medical scribe at [facility].The patient was seen in exam room 10 and the patient's care was started at 11:03 AM.   Patient ID: Karen Jensen, female    DOB: [birth year], 29 y.o.   MRN: [id number] Chief Complaint  Patient presents with  . Ear Pain    right ear pain--using the otc allergy meds  . Sinusitis    sinus pressure, worse on the right side  . Depression    triage    HPI HPI Comments: Karen Jensen is a 29 y.o. female who presents to [facility] right sided facial pain and a headache, onset two weeks ago. Pt is also congested and has rhinorrhea Does wake up in the night wheezing and shortness of breath. She also complains of dental pain ongoing for several years. Pt has not seen a dentist in several years due to cost.  Taking ibuprofen and extra strength tylenol. Taking two tylenol and up to 800 mg ibuprofen two to three times a day. Also taking zyrtec and generic zyrtec which is worsening her symptoms. Pt was on amoxicillin over 3 months ago, which was the last time she was given antibiotics. Pt states she typically will get a yeast infection on antibiotics. Using her inhaler everyday, twice a day. Also has rescue inhaler which she has not used in over two weeks.  Pt states she is depressed about once a year and did not want to discuss this today.  Past Medical History  Diagnosis Date  . Asthma   . Arthritis   . Eczema    Current Outpatient Prescriptions on File Prior to Visit  Medication Sig Dispense Refill  . albuterol (PROVENTIL HFA;VENTOLIN HFA) 108 (90 BASE) MCG/ACT inhaler Inhale 1-2 puffs into the lungs every 4 (four) hours as needed for wheezing or shortness of breath. 1 Inhaler 11  . gabapentin (NEURONTIN) 300 MG capsule Take 1 capsule (300 mg total) by mouth 3 (three) times daily. 90 capsule 5  . mometasone-formoterol (DULERA) 200-5 MCG/ACT  AERO Inhale 2 puffs into the lungs daily. 8.8 g 5  . amoxicillin (AMOXIL) 500 MG capsule Take 1 capsule (500 mg total) by mouth 2 (two) times daily. (Patient not taking: Reported on 05/21/2014) 20 capsule 0  . clobetasol cream (TEMOVATE) 0.05 % Apply 1 application topically 2 (two) times daily. (Patient not taking: Reported on 05/21/2014) 30 g 5  . fluconazole (DIFLUCAN) 150 MG tablet Take 1 tablet (150 mg total) by mouth once. May repeat prn in 3 days if not improvement with yeast (Patient not taking: Reported on 05/21/2014) 2 tablet 0  . HYDROcodone-acetaminophen (NORCO) 5-325 MG per tablet Take 1 tablet by mouth every 8 (eight) hours as needed for moderate pain or severe pain. (Patient not taking: Reported on 05/21/2014) 20 tablet 0   No current facility-administered medications on file prior to visit.   No Known Allergies  Review of Systems  Constitutional: Positive for chills, diaphoresis, activity change, appetite change and fatigue. Negative for fever and unexpected weight change.  HENT: Positive for congestion, dental problem, facial swelling, postnasal drip, rhinorrhea and sinus pressure.   Eyes: Negative for visual disturbance.  Respiratory: Positive for cough, chest tightness, shortness of breath and wheezing.   Gastrointestinal: Negative for nausea and vomiting.  Skin: Negative for rash.  Neurological: Positive for headaches.  Psychiatric/Behavioral: Positive for sleep disturbance and dysphoric mood. The patient  is not nervous/anxious.       Objective:  BP 120/70 mmHg  Pulse 83  Temp(Src) 98.8 F (37.1 C) (Oral)  Resp 20  Ht 5' 0.5" (1.537 m)  Wt 195 lb 6 oz (88.622 kg)  BMI 37.51 kg/m2  SpO2 98%  LMP 05/21/2014 Physical Exam  Constitutional: She is oriented to person, place, and time. She appears well-developed and well-nourished. No distress.  HENT:  Head: Normocephalic and atraumatic.  Right Ear: Tympanic membrane is scarred, erythematous and retracted. A middle ear  effusion is present.  Left Ear: Tympanic membrane is scarred, erythematous and retracted. A middle ear effusion is present.  Nose: Mucosal edema present.  Mouth/Throat: Posterior oropharyngeal edema and posterior oropharyngeal erythema present.  2-3+ plus tonsillar edema, exam obstructed due to tongue. Internal gums normal, no obvious cares or ab but there is erythema of external gum immediately below and above right molar upper and lower. Upper worst than left.  Eyes: Pupils are equal, round, and reactive to light.  Neck: Normal range of motion.  Cardiovascular: Normal rate and regular rhythm.   Pulmonary/Chest: Effort normal. No respiratory distress. She has wheezes.  Good air movement. Faint expiratory wheezes in lower lobes but does not worsens with forced expiration.  Musculoskeletal: Normal range of motion.  Lymphadenopathy:    She has no cervical adenopathy.       Right: No supraclavicular adenopathy present.       Left: No supraclavicular adenopathy present.  Neurological: She is alert and oriented to person, place, and time.  Skin: Skin is warm and dry.  Psychiatric: She has a normal mood and affect. Her behavior is normal.  Nursing note and vitals reviewed.     Assessment & Plan:   1. Acute recurrent maxillary sinusitis   2. Pain, dental   3. Ear pain, right   4. Dental caries       Meds ordered this encounter  Medications  . fluconazole (DIFLUCAN) 150 MG tablet    Sig: Take 1 tablet (150 mg total) by mouth once.    Dispense:  1 tablet    Refill:  0  . amoxicillin-clavulanate (AUGMENTIN) 875-125 MG per tablet    Sig: Take 1 tablet by mouth 2 (two) times daily.    Dispense:  28 tablet    Refill:  0  . predniSONE (DELTASONE) 20 MG tablet    Sig: Take 3 tabs po qd x 3d, take 2 tabs po qd x 3d, then take 1 tab po qd x3d    Dispense:  18 tablet    Refill:  0  . budesonide (RHINOCORT AQUA) 32 MCG/ACT nasal spray    Sig: Place 2 sprays into both nostrils daily.     Dispense:  8.6 g    Refill:  2  . fexofenadine (ALLEGRA) 180 MG tablet    Sig: Take 1 tablet (180 mg total) by mouth daily.    Dispense:  90 tablet    Refill:  3    I personally performed the services described in this documentation, which was scribed in my presence. The recorded information has been reviewed and considered, and addended by me as needed.  Karen SorensonEva Shaw, MD MPH

## 2014-05-21 NOTE — Patient Instructions (Addendum)
Hot showers or breathing in steam may help loosen the congestion.  Using a netti pot or sinus rinse is also likely to help you feel better and keep this from progressing.  Use the rhinocort nasal spray every night before bed for at least 2 weeks.  I recommend augmenting with 12 hr sudafed (behind the counter) to help you move out the congestion.  If no improvement or you are getting worse, come back as you might need a different treatment but hopefully you can avoid it.  Call 3-4 different dentists and ask what price it is for an initial exam - you need to be checked for cavities and for infection. If you could get an appointment around 2 weeks from now as you are coming off the antibiotic that is great.  You can find a dentist by calling your insurance carrier to find out who is preferred under their plan or by calling [facility] - they will be able to look up your plan usually.  I go to [facility] [address] [facility] - google [facility] - his office is very good but there are quite a few good offices in town.  Sinusitis Sinusitis is redness, soreness, and inflammation of the paranasal sinuses. Paranasal sinuses are air pockets within the bones of your face (beneath the eyes, the middle of the forehead, or above the eyes). In healthy paranasal sinuses, mucus is able to drain out, and air is able to circulate through them by way of your nose. However, when your paranasal sinuses are inflamed, mucus and air can become trapped. This can allow bacteria and other germs to grow and cause infection. Sinusitis can develop quickly and last only a short time (acute) or continue over a long period (chronic). Sinusitis that lasts for more than 12 weeks is considered chronic.  CAUSES  Causes of sinusitis include:  Allergies.  Structural abnormalities, such as displacement of the cartilage that separates your nostrils (deviated septum), which can decrease the air flow through your nose and  sinuses and affect sinus drainage.  Functional abnormalities, such as when the small hairs (cilia) that line your sinuses and help remove mucus do not work properly or are not present. SIGNS AND SYMPTOMS  Symptoms of acute and chronic sinusitis are the same. The primary symptoms are pain and pressure around the affected sinuses. Other symptoms include:  Upper toothache.  Earache.  Headache.  Bad breath.  Decreased sense of smell and taste.  A cough, which worsens when you are lying flat.  Fatigue.  Fever.  Thick drainage from your nose, which often is green and may contain pus (purulent).  Swelling and warmth over the affected sinuses. DIAGNOSIS  Your health care provider will perform a physical exam. During the exam, your health care provider may:  Look in your nose for signs of abnormal growths in your nostrils (nasal polyps).  Tap over the affected sinus to check for signs of infection.  View the inside of your sinuses (endoscopy) using an imaging device that has a light attached (endoscope). If your health care provider suspects that you have chronic sinusitis, one or more of the following tests may be recommended:  Allergy tests.  Nasal culture. A sample of mucus is taken from your nose, sent to a lab, and screened for bacteria.  Nasal cytology. A sample of mucus is taken from your nose and examined by your health care provider to determine if your sinusitis is related to an allergy.  TREATMENT  Most cases of acute sinusitis are related to a viral infection and will resolve on their own within 10 days. Sometimes medicines are prescribed to help relieve symptoms (pain medicine, decongestants, nasal steroid sprays, or saline sprays).  However, for sinusitis related to a bacterial infection, your health care provider will prescribe antibiotic medicines. These are medicines that will help kill the bacteria causing the infection.  Rarely, sinusitis is caused by a fungal  infection. In theses cases, your health care provider will prescribe antifungal medicine. For some cases of chronic sinusitis, surgery is needed. Generally, these are cases in which sinusitis recurs more than 3 times per year, despite other treatments. HOME CARE INSTRUCTIONS   Drink plenty of water. Water helps thin the mucus so your sinuses can drain more easily.  Use a humidifier.  Inhale steam 3 to 4 times a day (for example, sit in the bathroom with the shower running).  Apply a warm, moist washcloth to your face 3 to 4 times a day, or as directed by your health care provider.  Use saline nasal sprays to help moisten and clean your sinuses.  Take medicines only as directed by your health care provider.  If you were prescribed either an antibiotic or antifungal medicine, finish it all even if you start to feel better. SEEK IMMEDIATE MEDICAL CARE IF:  You have increasing pain or severe headaches.  You have nausea, vomiting, or drowsiness.  You have swelling around your face.  You have vision problems.  You have a stiff neck.  You have difficulty breathing. MAKE SURE YOU:   Understand these instructions.  Will watch your condition.  Will get help right away if you are not doing well or get worse. Document Released: 01/06/2005 Document Revised: 05/23/2013 Document Reviewed: 01/21/2011 [facility] Patient Information 2015 [place], [place]. This information is not intended to replace advice given to you by your health care provider. Make sure you discuss any questions you have with your health care provider.

## 2014-06-06 ENCOUNTER — Ambulatory Visit (INDEPENDENT_AMBULATORY_CARE_PROVIDER_SITE_OTHER): Payer: BLUE CROSS/BLUE SHIELD | Admitting: Physician Assistant

## 2014-06-06 VITALS — BP 138/86 | HR 73 | Temp 98.3°F | Resp 16 | Ht 60.0 in | Wt 195.5 lb

## 2014-06-06 DIAGNOSIS — K029 Dental caries, unspecified: Secondary | ICD-10-CM | POA: Diagnosis not present

## 2014-06-06 DIAGNOSIS — K0889 Other specified disorders of teeth and supporting structures: Secondary | ICD-10-CM

## 2014-06-06 DIAGNOSIS — K088 Other specified disorders of teeth and supporting structures: Secondary | ICD-10-CM

## 2014-06-06 MED ORDER — AMOXICILLIN 500 MG PO CAPS: 500.0000 mg | ORAL_CAPSULE | Freq: Three times a day (TID) | ORAL | Status: DC

## 2014-06-06 MED ORDER — FLUCONAZOLE 150 MG PO TABS: ORAL_TABLET | ORAL | Status: DC

## 2014-06-06 MED ORDER — HYDROCODONE-ACETAMINOPHEN 5-325 MG PO TABS: 1.0000 | ORAL_TABLET | Freq: Four times a day (QID) | ORAL | Status: DC | PRN

## 2014-06-06 MED ORDER — IBUPROFEN 800 MG PO TABS: 800.0000 mg | ORAL_TABLET | Freq: Three times a day (TID) | ORAL | Status: DC | PRN

## 2014-06-06 NOTE — Progress Notes (Signed)
   Subjective:    Patient ID: Karen Jensen, female    DOB: 10/03/1985, 29 y.o.   MRN: [id number]  HPI Pt presents to clinic with recurrent dental pain on the right side upper molar.  She has had problems with this tooth before.  She has been seen here in Jan and March for this same tooth.  On June 2 - appt with dentist but she can not continue with this pain until then.  She is not sleeping well and she is having a lot of pain while she is working.  Review of Systems  Constitutional: Negative for fever and chills.  HENT: Positive for dental problem.        Objective:   Physical Exam  Constitutional: She is oriented to person, place, and time. She appears well-developed and well-nourished.  BP 138/86 mmHg  Pulse 73  Temp(Src) 98.3 F (36.8 C) (Oral)  Resp 16  Ht 5' (1.524 m)  Wt 195 lb 8 oz (88.678 kg)  BMI 38.18 kg/m2  SpO2 99%  LMP 05/21/2014   HENT:  Head: Normocephalic and atraumatic.  Right Ear: External ear normal.  Left Ear: External ear normal.  Mouth/Throat: Dental abscesses (TTP over the gum line of her posterior molar) and dental caries (right upoper back molar) present.  Eyes: Conjunctivae are normal.  Neck: Normal range of motion.  Pulmonary/Chest: Effort normal.  Neurological: She is alert and oriented to person, place, and time.  Skin: Skin is warm and dry.  Psychiatric: She has a normal mood and affect. Her behavior is normal. Judgment and thought content normal.       Assessment & Plan:  Pain, dental - Plan: fluconazole (DIFLUCAN) 150 MG tablet, HYDROcodone-acetaminophen (NORCO/VICODIN) 5-325 MG per tablet, ibuprofen (ADVIL,MOTRIN) 800 MG tablet  Dental caries - Plan: amoxicillin (AMOXIL) 500 MG capsule  Pt to rinse with salt water or H2O2 water esp after she eats.  We discussed that this is the 3rd time she has been seen for this and she must go to the dentist that is the only way for this to be fixed.  Benny LennertSarah Altair Appenzeller PA-C  [facility] 06/06/2014 9:31 AM

## 2014-06-06 NOTE — Patient Instructions (Signed)
Mouth water rinses after you eat Take the motrin with the Amoxicillin and then take the pain medication if you still have pain.  Do not miss the dental appt.

## 2014-06-06 NOTE — Progress Notes (Signed)
  Medical screening examination/treatment/procedure(s) were performed by non-physician practitioner and as supervising physician I was immediately available for consultation/collaboration.     

## 2014-06-12 ENCOUNTER — Telehealth: Payer: Self-pay

## 2014-06-12 NOTE — Telephone Encounter (Signed)
Left vmail. Will not refill hydrocodone at this time. Can use ibuprofen for pain sx as provider gave her that as well. Can gargle with warm salt water. Should keep appt with dentist 06/20/14. If pain continues can send referral to emergency dentist and they may be able to fit in sooner.

## 2014-06-12 NOTE — Telephone Encounter (Signed)
Pt was seen [id number] for a toothache and ran out of hydrocodone. Pt is requesting an rx to help her sleep and reduce the pain. She has a dentist appt on 5-31. [phone]

## 2014-06-12 NOTE — Telephone Encounter (Signed)
Is this anything we can do for pt?

## 2015-01-04 ENCOUNTER — Encounter (HOSPITAL_COMMUNITY): Payer: Self-pay | Admitting: Emergency Medicine

## 2015-01-04 ENCOUNTER — Emergency Department (INDEPENDENT_AMBULATORY_CARE_PROVIDER_SITE_OTHER)
Admission: EM | Admit: 2015-01-04 | Discharge: 2015-01-04 | Disposition: A | Discharge status: Home / Self Care | Payer: BLUE CROSS/BLUE SHIELD | Source: Home / Self Care | Attending: Family Medicine | Admitting: Family Medicine

## 2015-01-04 DIAGNOSIS — M779 Enthesopathy, unspecified: Secondary | ICD-10-CM

## 2015-01-04 DIAGNOSIS — M778 Other enthesopathies, not elsewhere classified: Secondary | ICD-10-CM

## 2015-01-04 MED ORDER — DICLOFENAC POTASSIUM 50 MG PO TABS: 50.0000 mg | ORAL_TABLET | Freq: Three times a day (TID) | ORAL | Status: DC

## 2015-01-04 NOTE — ED Provider Notes (Signed)
CSN: [id number]     Arrival date & time 01/04/15  1514 History   First MD Initiated Contact with Patient 01/04/15 1742     Chief Complaint  Patient presents with  . Wrist Pain   (Consider location/radiation/quality/duration/timing/severity/associated sxs/prior Treatment) HPI Comments: 29 year old female is complaining of shooting pain to the left wrist for one month. It is getting worse. She works in a [employer] which she has to repetitively frequently move items from one place to the other. The pain is located to the volar wrist and forearm. No singular injurious event. Gradual onset.   Past Medical History  Diagnosis Date  . Asthma   . Arthritis   . Eczema    History reviewed. No pertinent past surgical history. Family History  Problem Relation Age of Onset  . Heart disease Father    Social History  Substance Use Topics  . Smoking status: Current Every Day Smoker -- 0.25 packs/day    Types: Cigarettes  . Smokeless tobacco: None  . Alcohol Use: No   OB History    No data available     Review of Systems  Constitutional: Negative for fever, chills and activity change.  Respiratory: Negative.   Cardiovascular: Negative.   Musculoskeletal: Negative for neck pain.       As per HPI  Skin: Negative for color change, pallor and rash.  Neurological: Negative.     Allergies  Review of patient's allergies indicates no known allergies.  Home Medications   Prior to Admission medications   Medication Sig Start Date End Date Taking? Authorizing Provider  albuterol (PROVENTIL HFA;VENTOLIN HFA) 108 (90 BASE) MCG/ACT inhaler Inhale 1-2 puffs into the lungs every 4 (four) hours as needed for wheezing or shortness of breath. 01/24/14   Thao P Le, DO  amoxicillin (AMOXIL) 500 MG capsule Take 1 capsule (500 mg total) by mouth 3 (three) times daily. 06/06/14   Morrell RiddleSarah L Weber, PA-C  budesonide (RHINOCORT AQUA) 32 MCG/ACT nasal spray Place 2 sprays into both nostrils daily. 05/21/14   Sherren MochaEva N  Shaw, MD  diclofenac (CATAFLAM) 50 MG tablet Take 1 tablet (50 mg total) by mouth 3 (three) times daily. One tablet TID with food prn pain. 01/04/15   Hayden Rasmussenavid Herby Amick, NP  fexofenadine (ALLEGRA) 180 MG tablet Take 1 tablet (180 mg total) by mouth daily. 05/21/14   Sherren MochaEva N Shaw, MD  fluconazole (DIFLUCAN) 150 MG tablet Repeat in 3 days if needed 06/06/14   Morrell RiddleSarah L Weber, PA-C  gabapentin (NEURONTIN) 300 MG capsule Take 1 capsule (300 mg total) by mouth 3 (three) times daily. 01/24/14   Thao P Le, DO  HYDROcodone-acetaminophen (NORCO/VICODIN) 5-325 MG per tablet Take 1 tablet by mouth every 6 (six) hours as needed for moderate pain. 06/06/14   Morrell RiddleSarah L Weber, PA-C  ibuprofen (ADVIL,MOTRIN) 800 MG tablet Take 1 tablet (800 mg total) by mouth every 8 (eight) hours as needed. 06/06/14   Morrell RiddleSarah L Weber, PA-C  mometasone-formoterol (DULERA) 200-5 MCG/ACT AERO Inhale 2 puffs into the lungs daily. 01/24/14   Thao P Le, DO   Meds Ordered and Administered this Visit  Medications - No data to display  BP 126/92 mmHg  Pulse 74  Temp(Src) 98.6 F (37 C) (Oral)  Resp 16  SpO2 100%  LMP 12/09/2014 No data found.   Physical Exam  Constitutional: She is oriented to person, place, and time. She appears well-developed and well-nourished. No distress.  Eyes: EOM are normal.  Neck: Normal range of motion. Neck  supple.  Cardiovascular: Normal rate.   Pulmonary/Chest: Effort normal. No respiratory distress.  Musculoskeletal: She exhibits no edema.  Pain with extension and especially with flexion of the left wrist. Pain radiates from the wrist along the volar tendons of the forearm. Tenderness to the left volar wrist and forearm tracking the flexor tendons. No swelling. Distal neurovascular motor Sentry is intact. Radial pulses 2+.  Neurological: She is alert and oriented to person, place, and time. She exhibits normal muscle tone.  Skin: Skin is warm and dry.  Psychiatric: She has a normal mood and affect.  Nursing note and  vitals reviewed.   ED Course  Procedures (including critical care time)  Labs Review Labs Reviewed - No data to display  Imaging Review No results found.   Visual Acuity Review  Right Eye Distance:   Left Eye Distance:   Bilateral Distance:    Right Eye Near:   Left Eye Near:    Bilateral Near:         MDM   1. Left wrist tendinitis   2. Tendinitis of forearm    Wear splint, left wrist for the next several days, especially at work Ice to the areas of soreness frequently Cataflam as directed take with food Follow-up with your PCP.    Catalaya Garr MabeHayden Rasmussen/15/16 [phone]

## 2015-01-04 NOTE — ED Notes (Addendum)
Left wrist/forearm pain, numbness/tingling in middle and ring finger.  Pain is shooting from wrist up forearm to elbow.  Patient has a history of carpel tunnel in right wrist.  Patient reports symptoms for a month.  Patient is right hand

## 2015-01-24 ENCOUNTER — Ambulatory Visit (INDEPENDENT_AMBULATORY_CARE_PROVIDER_SITE_OTHER): Payer: BLUE CROSS/BLUE SHIELD | Admitting: Physician Assistant

## 2015-01-24 VITALS — BP 126/84 | HR 85 | Temp 97.9°F | Resp 18 | Ht 60.25 in | Wt 188.4 lb

## 2015-01-24 DIAGNOSIS — J45909 Unspecified asthma, uncomplicated: Secondary | ICD-10-CM | POA: Diagnosis not present

## 2015-01-24 DIAGNOSIS — G5603 Carpal tunnel syndrome, bilateral upper limbs: Secondary | ICD-10-CM | POA: Diagnosis not present

## 2015-01-24 DIAGNOSIS — R51 Headache: Secondary | ICD-10-CM | POA: Diagnosis not present

## 2015-01-24 DIAGNOSIS — G43909 Migraine, unspecified, not intractable, without status migrainosus: Secondary | ICD-10-CM | POA: Insufficient documentation

## 2015-01-24 DIAGNOSIS — G43901 Migraine, unspecified, not intractable, with status migrainosus: Secondary | ICD-10-CM | POA: Diagnosis not present

## 2015-01-24 DIAGNOSIS — Z23 Encounter for immunization: Secondary | ICD-10-CM

## 2015-01-24 DIAGNOSIS — G5600 Carpal tunnel syndrome, unspecified upper limb: Secondary | ICD-10-CM | POA: Insufficient documentation

## 2015-01-24 DIAGNOSIS — R519 Headache, unspecified: Secondary | ICD-10-CM

## 2015-01-24 DIAGNOSIS — G5601 Carpal tunnel syndrome, right upper limb: Secondary | ICD-10-CM

## 2015-01-24 MED ORDER — GABAPENTIN 300 MG PO CAPS: 300.0000 mg | ORAL_CAPSULE | Freq: Three times a day (TID) | ORAL | Status: DC

## 2015-01-24 MED ORDER — ALBUTEROL SULFATE HFA 108 (90 BASE) MCG/ACT IN AERS: 1.0000 | INHALATION_SPRAY | RESPIRATORY_TRACT | Status: DC | PRN

## 2015-01-24 MED ORDER — MOMETASONE FURO-FORMOTEROL FUM 200-5 MCG/ACT IN AERO: 2.0000 | INHALATION_SPRAY | Freq: Two times a day (BID) | RESPIRATORY_TRACT | Status: DC

## 2015-01-24 MED ORDER — IBUPROFEN 200 MG PO TABS
800.0000 mg | ORAL_TABLET | Freq: Once | ORAL | Status: AC
  Administered 2015-01-24: 800 mg via ORAL

## 2015-01-24 NOTE — Patient Instructions (Addendum)
Please continue the allergy pill. Controlling your allergies can help reduce your asthma symptoms. [facility] the [facility] times each day. Every day. It is your MAINTENANCE medication. Use the albuterol if you have cough, wheezing, shortness or breath. It is your RESCUE medication.  Please wear the wrist splints. If you cannot wear them at work, at least wear them WHILE YOU SLEEP. Not wearing the splints can cause the condition to worsen, which may result in the medications not being effective. Go ahead and fill the Cataflam (the anti-inflammatory) that you got from the other Urgent Care. Find out what dose gabapentin your mother takes, and I'll send that in for you.  Stop Smoking! Did you know that you begin to benefit from quitting smoking within the first twenty minutes? It's TRUE.  At 20 minutes: -blood pressure decreases -pulse rate drops -body temperature of hands and feet increases  At 8 hours: -carbon monoxide level in blood drops to normal -oxygen level in blood increases to normal  At 24 hours: -the chance of heart attack decreases  At 48 hours: -nerve endings start regrowing -ability to smell and taste is enhanced  2 weeks-3 months: -circulation improves -walking becomes easier -lung function improves  1-9 months: -coughing, sinus congestion, fatigue and shortness of breath decreases  1 year: -excess risk of heart disease is decreased to HALF that of a smoker  5 years: Stroke risk is reduced to that of people who have never smoked  10 years: -risk of lung cancer drops to as little as half that of continuing smokers -risk of cancer of the mouth, throat, esophagus, bladder, kidney and pancreas decreases -risk of ulcer decreases  15 years -risk of heart disease is now similar to that of people who have never smoked -risk of death returns to nearly the level of people who have never smoked  Influenza Virus Vaccine injection What is this medicine? INFLUENZA VIRUS  VACCINE (in floo EN zuh VAHY ruhs vak SEEN) helps to reduce the risk of getting influenza also known as the flu. The vaccine only helps protect you against some strains of the flu. This medicine may be used for other purposes; ask your health care provider or pharmacist if you have questions. What should I tell my health care provider before I take this medicine? They need to know if you have any of these conditions: -bleeding disorder like hemophilia -fever or infection -Guillain-Barre syndrome or other neurological problems -immune system problems -infection with the human immunodeficiency virus (HIV) or AIDS -low blood platelet counts -multiple sclerosis -an unusual or allergic reaction to influenza virus vaccine, latex, other medicines, foods, dyes, or preservatives. Different brands of vaccines contain different allergens. Some may contain latex or eggs. Talk to your doctor about your allergies to make sure that you get the right vaccine. -pregnant or trying to get pregnant -breast-feeding How should I use this medicine? This vaccine is for injection into a muscle or under the skin. It is given by a health care professional. A copy of Vaccine Information Statements will be given before each vaccination. Read this sheet carefully each time. The sheet may change frequently. Talk to your healthcare provider to see which vaccines are right for you. Some vaccines should not be used in all age groups. Overdosage: If you think you have taken too much of this medicine contact a poison control center or emergency room at once. NOTE: This medicine is only for you. Do not share this medicine with others. What if I  miss a dose? This does not apply. What may interact with this medicine? -chemotherapy or radiation therapy -medicines that lower your immune system like etanercept, anakinra, infliximab, and adalimumab -medicines that treat or prevent blood clots like warfarin -phenytoin -steroid  medicines like prednisone or cortisone -theophylline -vaccines This list may not describe all possible interactions. Give your health care provider a list of all the medicines, herbs, non-prescription drugs, or dietary supplements you use. Also tell them if you smoke, drink alcohol, or use illegal drugs. Some items may interact with your medicine. What should I watch for while using this medicine? Report any side effects that do not go away within 3 days to your doctor or health care professional. Call your health care provider if any unusual symptoms occur within 6 weeks of receiving this vaccine. You may still catch the flu, but the illness is not usually as bad. You cannot get the flu from the vaccine. The vaccine will not protect against colds or other illnesses that may cause fever. The vaccine is needed every year. What side effects may I notice from receiving this medicine? Side effects that you should report to your doctor or health care professional as soon as possible: -allergic reactions like skin rash, itching or hives, swelling of the face, lips, or tongue Side effects that usually do not require medical attention (report to your doctor or health care professional if they continue or are bothersome): -fever -headache -muscle aches and pains -pain, tenderness, redness, or swelling at the injection site -tiredness This list may not describe all possible side effects. Call your doctor for medical advice about side effects. You may report side effects to FDA at 1-800-FDA-1088. Where should I keep my medicine? The vaccine will be given by a health care professional in a clinic, pharmacy, doctor's office, or other health care setting. You will not be given vaccine doses to store at home. NOTE: This sheet is a summary. It may not cover all possible information. If you have questions about this medicine, talk to your doctor, pharmacist, or health care provider.    2016, [employer]vier/Gold  Standard. (2014-07-28 10:07:28)

## 2015-01-24 NOTE — Progress Notes (Signed)
Patient ID: Karen Jensen, female    DOB: 1985/06/11, 30 y.o.   MRN: [id number]  PCP: No PCP Per Patient  Subjective:   Chief Complaint  Patient presents with  . Medication Refill    albuterol, gabapentin, dulera  . Migraine    HPI Presents for medication refills.  She has asthma, migraine headache and carpal tunnel syndrome and No PCP.  Migraine (self diagnosed). Throbbing head pain. Photophobia. Sometimes going to bed will resolve the pain, not always. Triggered by bright lights. Notes an astigmatism that may also trigger headache. Thinks her pupils get smaller when her headache is coming on.  No identified aura. Has had some nausea at work associated with headache, but she thought that was due to the smoke in her work environment. No numbness or tingling, weakness. No vision loss, but maybe some light flashes. "What helps the best is Excedrin migraine." Ibuprofen 1200 mg required for any effect. Headaches occur "Not that often": not more than once each week.  Asthma: Lots of smoke exposure at work. Doesn't use Dulera consistently. THought it was the rescue medication. Her sister has a nebulizer that the patient has used and prevented the need to go the the ED. Previously on Qvar and albuterol. Denies hospitalization and intubation for asthma. "I don't think the Elwin Sleight is working. I used to be able to take a puff and keep going, not even have to take it every day." Intolerant to nasal sprays.   CTS: originally diagnosed here about a year ago. Initially, naproxen was working, but then failed. Gabapentin helps, but she still has pain. Her mother takes a reportedly stronger but undetermined dose, and when the patient takes that one, her pain is controlled all day. [facility] at [facility] diagnosed with tendonitis. Given wrist splints, but hates them and doesn't wear them. Hasn't filled the anti-inflammatory yet (Cataflam).        Review of Systems As above.    Patient Active  Problem List   Diagnosis Date Noted  . Migraine 01/24/2015  . Asthma, chronic 01/24/2015  . Carpal tunnel syndrome 01/24/2015     Prior to Admission medications   Medication Sig Start Date End Date Taking? Authorizing Provider  albuterol (PROVENTIL HFA;VENTOLIN HFA) 108 (90 BASE) MCG/ACT inhaler Inhale 1-2 puffs into the lungs every 4 (four) hours as needed for wheezing or shortness of breath. 01/24/14  Yes Thao P Le, DO  fexofenadine (ALLEGRA) 180 MG tablet Take 1 tablet (180 mg total) by mouth daily. 05/21/14  Yes Sherren Mocha, MD  gabapentin (NEURONTIN) 300 MG capsule Take 1 capsule (300 mg total) by mouth 3 (three) times daily. 01/24/14  Yes Thao P Le, DO  mometasone-formoterol (DULERA) 200-5 MCG/ACT AERO Inhale 2 puffs into the lungs daily. 01/24/14  Yes Thao P Le, DO  diclofenac (CATAFLAM) 50 MG tablet Take 1 tablet (50 mg total) by mouth 3 (three) times daily. One tablet TID with food prn pain. Patient not taking: Reported on 01/24/2015 01/04/15   Hayden Rasmussen, NP     No Known Allergies     Objective:  Physical Exam  Constitutional: She is oriented to person, place, and time. Vital signs are normal. She appears well-developed and well-nourished. She is active and cooperative. No distress.  BP 126/84 mmHg  Pulse 85  Temp(Src) 97.9 F (36.6 C)  Resp 18  Ht 5' 0.25" (1.53 m)  Wt 188 lb 6.4 oz (85.458 kg)  BMI 36.51 kg/m2  SpO2 99%  LMP 01/21/2015  HENT:  Head: Normocephalic and atraumatic.  Right Ear: Hearing, external ear and ear canal normal. Tympanic membrane is scarred.  Left Ear: Hearing, external ear and ear canal normal. Tympanic membrane is scarred.  Nose: Nose normal.  Mouth/Throat: Uvula is midline, oropharynx is clear and moist and mucous membranes are normal. No oral lesions.  Eyes: Conjunctivae, EOM and lids are normal. Pupils are equal, round, and reactive to light. No scleral icterus.  Fundoscopic exam:      The right eye shows no hemorrhage and no papilledema. The  right eye shows red reflex.       The left eye shows no hemorrhage and no papilledema. The left eye shows red reflex.  Neck: Normal range of motion and phonation normal. Neck supple. Thyromegaly present. No thyroid mass present.  Cardiovascular: Normal rate, regular rhythm and normal heart sounds.   Pulses:      Radial pulses are 2+ on the right side, and 2+ on the left side.  Pulmonary/Chest: Effort normal and breath sounds normal.  Lymphadenopathy:       Head (right side): No tonsillar, no preauricular, no posterior auricular and no occipital adenopathy present.       Head (left side): No tonsillar, no preauricular, no posterior auricular and no occipital adenopathy present.    She has no cervical adenopathy.       Right: No supraclavicular adenopathy present.       Left: No supraclavicular adenopathy present.  Neurological: She is alert and oriented to person, place, and time. She has normal strength. No sensory deficit.  Reflex Scores:      Bicep reflexes are 2+ on the right side and 2+ on the left side.      Patellar reflexes are 2+ on the right side and 2+ on the left side.      Achilles reflexes are 2+ on the right side and 2+ on the left side. Positive Phalen's bilaterally  Skin: Skin is warm, dry and intact. No rash noted. No cyanosis or erythema. Nails show no clubbing.  Psychiatric: She has a normal mood and affect. Her speech is normal and behavior is normal.           Assessment & Plan:   1. Headache, unspecified headache type Suspect migraine. - ibuprofen (ADVIL,MOTRIN) tablet 800 mg; Take 4 tablets (800 mg total) by mouth once.  2. Migraine with status migrainosus, not intractable, unspecified migraine type Anticipate improvement in headache frequency with improvement in asthma control. Reassess in 4 weeks. Refer to neurology if not improving.  3. Asthma, chronic, unspecified asthma severity, uncomplicated Counseled on use of maintenance and rescue meds, need to  quit smoking, etc. Increase Dulera to 2 puffs BID. RTC 4 weeks. - mometasone-formoterol (DULERA) 200-5 MCG/ACT AERO; Inhale 2 puffs into the lungs 2 (two) times daily.  Dispense: 13 g; Refill: 5 - albuterol (PROVENTIL HFA;VENTOLIN HFA) 108 (90 Base) MCG/ACT inhaler; Inhale 1-2 puffs into the lungs every 4 (four) hours as needed for wheezing or shortness of breath.  Dispense: 1 Inhaler; Refill: 2  4. Bilateral carpal tunnel syndrome 5. Carpal tunnel syndrome of right wrist Wear with wrist splints, at least at night. Continue gabapentin. Find out what dose her mother takes, and I will send that dose in for her. - gabapentin (NEURONTIN) 300 MG capsule; Take 1 capsule (300 mg total) by mouth 3 (three) times daily.  Dispense: 90 capsule; Refill: 5  6. Need for influenza vaccination - Flu Vaccine QUAD 36+ mos IM  Return in about 4 weeks (around 02/21/2015) for re-evaluation of asthma.   Fernande Brashelle S. Andrick Rust, PA-C Physician Assistant-Certified [facility]

## 2015-02-08 ENCOUNTER — Ambulatory Visit (INDEPENDENT_AMBULATORY_CARE_PROVIDER_SITE_OTHER): Payer: BLUE CROSS/BLUE SHIELD

## 2015-02-08 ENCOUNTER — Ambulatory Visit (INDEPENDENT_AMBULATORY_CARE_PROVIDER_SITE_OTHER): Payer: BLUE CROSS/BLUE SHIELD | Admitting: Physician Assistant

## 2015-02-08 ENCOUNTER — Encounter: Payer: Self-pay | Admitting: Physician Assistant

## 2015-02-08 VITALS — BP 110/72 | HR 88 | Temp 98.6°F | Ht 60.5 in | Wt 187.0 lb

## 2015-02-08 DIAGNOSIS — L309 Dermatitis, unspecified: Secondary | ICD-10-CM

## 2015-02-08 DIAGNOSIS — R1030 Lower abdominal pain, unspecified: Secondary | ICD-10-CM

## 2015-02-08 DIAGNOSIS — G5601 Carpal tunnel syndrome, right upper limb: Secondary | ICD-10-CM | POA: Diagnosis not present

## 2015-02-08 DIAGNOSIS — R350 Frequency of micturition: Secondary | ICD-10-CM | POA: Diagnosis not present

## 2015-02-08 DIAGNOSIS — R739 Hyperglycemia, unspecified: Secondary | ICD-10-CM

## 2015-02-08 DIAGNOSIS — R110 Nausea: Secondary | ICD-10-CM | POA: Diagnosis not present

## 2015-02-08 LAB — COMPREHENSIVE METABOLIC PANEL
ALBUMIN: 4.2 g/dL (ref 3.6–5.1)
ALK PHOS: 63 U/L (ref 33–115)
ALT: 14 U/L (ref 6–29)
AST: 12 U/L (ref 10–30)
BUN: 12 mg/dL (ref 7–25)
CHLORIDE: 107 mmol/L (ref 98–110)
CO2: 26 mmol/L (ref 20–31)
CREATININE: 0.78 mg/dL (ref 0.50–1.10)
Calcium: 9.8 mg/dL (ref 8.6–10.2)
Glucose, Bld: 86 mg/dL (ref 65–99)
Potassium: 4.1 mmol/L (ref 3.5–5.3)
SODIUM: 141 mmol/L (ref 135–146)
Total Bilirubin: 0.8 mg/dL (ref 0.2–1.2)
Total Protein: 6.8 g/dL (ref 6.1–8.1)

## 2015-02-08 LAB — POC MICROSCOPIC URINALYSIS (UMFC)

## 2015-02-08 LAB — POCT GLYCOSYLATED HEMOGLOBIN (HGB A1C): HEMOGLOBIN A1C: 5.4

## 2015-02-08 LAB — POCT URINALYSIS DIP (MANUAL ENTRY)
Bilirubin, UA: NEGATIVE
Blood, UA: NEGATIVE
Glucose, UA: NEGATIVE
Ketones, POC UA: NEGATIVE
LEUKOCYTES UA: NEGATIVE
Nitrite, UA: NEGATIVE
PH UA: 6
PROTEIN UA: NEGATIVE
Spec Grav, UA: 1.025
UROBILINOGEN UA: 2

## 2015-02-08 LAB — GLUCOSE, POCT (MANUAL RESULT ENTRY): POC GLUCOSE: 113 mg/dL — AB (ref 70–99)

## 2015-02-08 MED ORDER — GABAPENTIN 300 MG PO CAPS: 600.0000 mg | ORAL_CAPSULE | Freq: Three times a day (TID) | ORAL | Status: DC

## 2015-02-08 MED ORDER — POLYETHYLENE GLYCOL 3350 17 GM/SCOOP PO POWD: 17.0000 g | Freq: Two times a day (BID) | ORAL | Status: DC | PRN

## 2015-02-08 MED ORDER — CLOBETASOL PROPIONATE 0.05 % EX CREA
1.0000 | TOPICAL_CREAM | Freq: Two times a day (BID) | CUTANEOUS | Status: DC
Start: 2015-02-08 — End: 2019-04-05

## 2015-02-08 NOTE — Patient Instructions (Signed)
To help reduce constipation and promote bowel health, 1. Drink at least 64 ounces of water each day; 2. Eat plenty of fiber (fruits, vegetables, whole grains, legumes) 3. Get plenty of physical activity  If needed, use a stool softener (docusate) or an osmotic laxative (like Miralax) each day, or as needed.   Use the [facility] twice each day, about 12 hours apart.

## 2015-02-08 NOTE — Progress Notes (Signed)
Patient ID: Karen Jensen, female    DOB: [birth year], 30 y.o.   MRN: [id number]  PCP: Dagny Fiorentino, PA-C  Subjective:   Chief Complaint  Patient presents with  . Abdominal Pain    RLQ, LLQ pain x 3 weeks  - getting worse  . Nausea    HPI Presents for evaluation of a three week history of progressive RLQ, LLQ abdominal pain with associated nausea.   She first noticed this on 01/18/15 at which time she thought it was her "period cramps". Her LMP was on 01/21/15. She is not sexually active with men. After the resolution of her period the same pain persisted. It is an off/on pain that lasts for 10 minutes. She describes it as a "twisting pain" that make her nauseated and feel like she is going to vomit. She notices this most with smoking and when she is at work around smoke. Occasionally she will experience the pain after she eats. She rates the pain as a 7/10. She has tried asprin and tums for this with no relief. She has experienced associated anorexia with this and is unable to obtain an appetite unless she smokes marijuana. Lately she has consumed most of her calories from liquids, such as juice and soda. She also has had burping, increased gas and watery stools for the past few days which is unusual for her. She denies any fever, chills, unexpected weight change, vomiting, constipation, dysuria, urgency, vaginal bleeding, vaginal d/c, and vaginal pain.   When touching base on her Albuterol use she states she is using albuterol 2 times at work and once after. She states these are not for excerebration but as prevention. She is using the [facility] only once a day, simply unclear when she is to use it, since she works nights.    Review of Systems Constitutional: Negative for fever, chills and unexpected weight change.  HENT: Negative for sore throat.  Respiratory: Positive for chest tightness (due to smoke at work. Relieved with inhaler). Negative for shortness of breath.    Cardiovascular: Negative for chest pain.  Gastrointestinal: Positive for nausea, abdominal pain and diarrhea. Negative for vomiting, constipation, blood in stool and abdominal distention.  Genitourinary: Positive for frequency. Negative for dysuria, urgency, hematuria, flank pain, vaginal bleeding, vaginal discharge and vaginal pain.  Skin: Negative.     Patient Active Problem List   Diagnosis Date Noted  . Migraine 01/24/2015  . Asthma, chronic 01/24/2015  . Carpal tunnel syndrome 01/24/2015     Prior to Admission medications   Medication Sig Start Date End Date Taking? Authorizing Provider  albuterol (PROVENTIL HFA;VENTOLIN HFA) 108 (90 Base) MCG/ACT inhaler Inhale 1-2 puffs into the lungs every 4 (four) hours as needed for wheezing or shortness of breath. 01/24/15  Yes Brealynn Contino, PA-C  clobetasol cream (TEMOVATE) 0.05 % Apply 1 application topically 2 (two) times daily.   Yes Historical Provider, MD  fexofenadine (ALLEGRA) 180 MG tablet Take 1 tablet (180 mg total) by mouth daily. 05/21/14  Yes Sherren Mocha, MD  gabapentin (NEURONTIN) 300 MG capsule Take 1 capsule (300 mg total) by mouth 3 (three) times daily. 01/24/15  Yes Tawnee Clegg, PA-C  mometasone-formoterol (DULERA) 200-5 MCG/ACT AERO Inhale 2 puffs into the lungs 2 (two) times daily. 01/24/15  Yes Janelli Welling, PA-C  diclofenac (CATAFLAM) 50 MG tablet Take 1 tablet (50 mg total) by mouth 3 (three) times daily. One tablet TID with food prn pain. Patient not taking: Reported on 01/24/2015 01/04/15  Hayden Rasmussen, NP     No Known Allergies     Objective:  Physical Exam  Constitutional: She is oriented to person, place, and time. Vital signs are normal. She appears well-developed and well-nourished. She is active and cooperative. No distress.  BP 110/72 mmHg  Pulse 88  Temp(Src) 98.6 F (37 C) (Oral)  Ht 5' 0.5" (1.537 m)  Wt 187 lb (84.823 kg)  BMI 35.91 kg/m2  SpO2 97%  LMP 01/21/2015  HENT:  Head: Normocephalic and  atraumatic.  Right Ear: Hearing normal.  Left Ear: Hearing normal.  Eyes: Conjunctivae are normal. No scleral icterus.  Neck: Normal range of motion. Neck supple. No thyromegaly present.  Cardiovascular: Normal rate, regular rhythm and normal heart sounds.   Pulses:      Radial pulses are 2+ on the right side, and 2+ on the left side.  Pulmonary/Chest: Effort normal and breath sounds normal.  Abdominal: Normal appearance and bowel sounds are normal. There is no hepatosplenomegaly. There is tenderness (worst in the lower abdomen and suprapubic region). There is no CVA tenderness.  Lymphadenopathy:       Head (right side): No tonsillar, no preauricular, no posterior auricular and no occipital adenopathy present.       Head (left side): No tonsillar, no preauricular, no posterior auricular and no occipital adenopathy present.    She has no cervical adenopathy.       Right: No supraclavicular adenopathy present.       Left: No supraclavicular adenopathy present.  Neurological: She is alert and oriented to person, place, and time. No sensory deficit.  Skin: Skin is warm, dry and intact. No rash noted. No cyanosis or erythema. Nails show no clubbing.  Psychiatric: She has a normal mood and affect.       Results for orders placed or performed in visit on 02/08/15  POCT urinalysis dipstick  Result Value Ref Range   Color, UA yellow yellow   Clarity, UA clear clear   Glucose, UA negative negative   Bilirubin, UA negative negative   Ketones, POC UA negative negative   Spec Grav, UA 1.025    Blood, UA negative negative   pH, UA 6.0    Protein Ur, POC negative negative   Urobilinogen, UA 2.0    Nitrite, UA Negative Negative   Leukocytes, UA Negative Negative  POCT Microscopic Urinalysis ([facility])  Result Value Ref Range   WBC,UR,HPF,POC Few (A) None WBC/hpf   RBC,UR,HPF,POC None None RBC/hpf   Bacteria None None, Too numerous to count   Mucus Present (A) Absent   Epithelial Cells, UR  Per Microscopy Few (A) None, Too numerous to count cells/hpf  POCT glucose (manual entry)  Result Value Ref Range   POC Glucose 113 (A) 70 - 99 mg/dl  POCT glycosylated hemoglobin (Hb A1C)  Result Value Ref Range   Hemoglobin A1C 5.4     Acute Abdominal Series: [facility] reading (PRIMARY) by  Dr. Alwyn Ren. Non-specific bowel gas pattern. No free air. Small rounded density noted in the LEFT pelvis, likely a phlebolith. Of note, the patient's hair is visible in the RIGHT upper portion of the chest film.       Assessment & Plan:   1. Lower abdominal pain 2. Nausea without vomiting Suspect that this is due to constipation. Trial of Miralax, increase dietary fluids and fiber and increased physical activity. - POCT urinalysis dipstick - POCT Microscopic Urinalysis ([facility]) - DG Abd Acute W/Chest - polyethylene glycol powder (GLYCOLAX/MIRALAX) powder; Take  17 g by mouth 2 (two) times daily as needed.  Dispense: 3350 g; Refill: 1 - Comprehensive metabolic panel  3. Urinary frequency Likely due to constipation, as there is no evidence of urinary infection or diabetes. - POCT glucose (manual entry)  4. Hyperglycemia Mildly elevated non-fasting glucose. Normal A1C. - POCT glycosylated hemoglobin (Hb A1C)  5. Carpal tunnel syndrome of right wrist As we previously discussed, she'd check her mother's dose and we'd increase her to that. Her mother is taking 600 mg. - gabapentin (NEURONTIN) 300 MG capsule; Take 2 capsules (600 mg total) by mouth 3 (three) times daily.  Dispense: 180 capsule; Refill: 5  6. Eczema Controlled with PRN use of steroid cream. - clobetasol cream (TEMOVATE) 0.05 %; Apply 1 application topically 2 (two) times daily.  Dispense: 30 g; Refill: 1   She'll increase her Dulera to BID more consistently, after counseling. Schedule follow-up with me in 4-6 weeks, sooner if her current symptoms worsen/persist.   Fernande Bras, PA-C Physician Assistant-Certified [facility]

## 2015-02-08 NOTE — Progress Notes (Signed)
Subjective:    Patient ID: Karen Jensen, female    DOB: November 02, 1985, 30 y.o.   MRN: [id number]  Chief Complaint  Patient presents with  . Abdominal Pain    RLQ, LLQ pain x 3 weeks  - getting worse  . Nausea   HPI Patient presents today with a three week history of progressive RLQ, LLQ abdominal pain with associated nausea. She first noticed this on 01/18/15 at which time she thought it was her "period cramps". Her LMP was on 01/21/15. After the resolution of her period the same pain persisted. It is an off/on pain that lasts for 10 minutes. She describes it as a "twisting pain" that make her nauseated and feel like she is going to vomit. She notices this most with smoking and when she is at work around smoke. Occasionally she will experience the pain after she eats. She rates the pain as a 7/10.  She has tried asprin and tums for this with no relief. She has experienced associated anorexia with this and is unable to obtain an appetitite unless she smokes marijuana. Lately she has consumed most of her calories from liquids, such as juice and soda. She also has had burping, increased gas and watery stools for the past few days which is unusual for her. She denies any fever, chills, unexpected weight change, vomiting, constipation, dysuria, urgency, vaginal bleeding, vaginal d/c, and vaginal pain.   When touching base on her Albuterol use she states she is using albuterol 2 times at work and once after. She states these are not for excerebration but as prevention. Albuterol inhaler education recommended.   Review of Systems  Constitutional: Negative for fever, chills and unexpected weight change.  HENT: Negative for sore throat.   Respiratory: Positive for chest tightness (due to smoke at work. Relieved with inhaler). Negative for shortness of breath.   Cardiovascular: Negative for chest pain.  Gastrointestinal: Positive for nausea, abdominal pain and diarrhea. Negative for vomiting,  constipation, blood in stool and abdominal distention.  Genitourinary: Positive for frequency. Negative for dysuria, urgency, hematuria, flank pain, vaginal bleeding, vaginal discharge and vaginal pain.  Skin: Negative.    No Known Allergies  Prior to Admission medications   Medication Sig Start Date End Date Taking? Authorizing Provider  albuterol (PROVENTIL HFA;VENTOLIN HFA) 108 (90 Base) MCG/ACT inhaler Inhale 1-2 puffs into the lungs every 4 (four) hours as needed for wheezing or shortness of breath. 01/24/15  Yes Chelle Jeffery, PA-C  clobetasol cream (TEMOVATE) 0.05 % Apply 1 application topically 2 (two) times daily. 02/08/15  Yes Chelle Jeffery, PA-C  fexofenadine (ALLEGRA) 180 MG tablet Take 1 tablet (180 mg total) by mouth daily. 05/21/14  Yes Sherren Mocha, MD  gabapentin (NEURONTIN) 300 MG capsule Take 2 capsules (600 mg total) by mouth 3 (three) times daily. 02/08/15  Yes Chelle Jeffery, PA-C  mometasone-formoterol (DULERA) 200-5 MCG/ACT AERO Inhale 2 puffs into the lungs 2 (two) times daily. 01/24/15  Yes Chelle Jeffery, PA-C  diclofenac (CATAFLAM) 50 MG tablet Take 1 tablet (50 mg total) by mouth 3 (three) times daily. One tablet TID with food prn pain. Patient not taking: Reported on 01/24/2015 01/04/15   Hayden Rasmussen, NP  polyethylene glycol powder (GLYCOLAX/MIRALAX) powder Take 17 g by mouth 2 (two) times daily as needed. 02/08/15   Porfirio Oar, PA-C   Patient Active Problem List   Diagnosis Date Noted  . Migraine 01/24/2015  . Asthma, chronic 01/24/2015  . Carpal tunnel syndrome 01/24/2015  Objective:   Physical Exam  Constitutional: Vital signs are normal. She appears well-developed and well-nourished. No distress.  BP 110/72 mmHg  Pulse 88  Temp(Src) 98.6 F (37 C) (Oral)  Ht 5' 0.5" (1.537 m)  Wt 187 lb (84.823 kg)  BMI 35.91 kg/m2  SpO2 97%  LMP 01/21/2015   HENT:  Head: Normocephalic and atraumatic.  Right Ear: Hearing and external ear normal.  Left Ear: Hearing  and external ear normal.  Nose: Nose normal.  Mouth/Throat: Uvula is midline, oropharynx is clear and moist and mucous membranes are normal.  Eyes: Pupils are equal, round, and reactive to light.  Neck: Neck supple.  Cardiovascular: Normal rate, regular rhythm, normal heart sounds and intact distal pulses.  Exam reveals no gallop and no friction rub.   No murmur heard. Pulmonary/Chest: Effort normal. No respiratory distress. She has wheezes. She has no rales.  Abdominal: Soft. Normal appearance and bowel sounds are normal. She exhibits no shifting dullness, no distension, no pulsatile liver, no fluid wave, no abdominal bruit, no ascites, no pulsatile midline mass and no mass. There is no hepatosplenomegaly. There is tenderness in the suprapubic area. There is no rigidity, no rebound, no guarding, no tenderness at McBurney's point and negative Murphy's sign.  Lymphadenopathy:       Head (right side): No submental, no submandibular, no tonsillar, no preauricular, no posterior auricular and no occipital adenopathy present.       Head (left side): No submental, no submandibular, no tonsillar, no preauricular, no posterior auricular and no occipital adenopathy present.    She has no cervical adenopathy.  Neurological: She is alert.  Skin: Skin is warm and dry.  Vitals reviewed.  Results for orders placed or performed in visit on 02/08/15  POCT urinalysis dipstick  Result Value Ref Range   Color, UA yellow yellow   Clarity, UA clear clear   Glucose, UA negative negative   Bilirubin, UA negative negative   Ketones, POC UA negative negative   Spec Grav, UA 1.025    Blood, UA negative negative   pH, UA 6.0    Protein Ur, POC negative negative   Urobilinogen, UA 2.0    Nitrite, UA Negative Negative   Leukocytes, UA Negative Negative  POCT Microscopic Urinalysis ([facility])  Result Value Ref Range   WBC,UR,HPF,POC Few (A) None WBC/hpf   RBC,UR,HPF,POC None None RBC/hpf   Bacteria None None, Too  numerous to count   Mucus Present (A) Absent   Epithelial Cells, UR Per Microscopy Few (A) None, Too numerous to count cells/hpf  POCT glucose (manual entry)  Result Value Ref Range   POC Glucose 113 (A) 70 - 99 mg/dl  POCT glycosylated hemoglobin (Hb A1C)  Result Value Ref Range   Hemoglobin A1C 5.4        Assessment & Plan:  1. Lower abdominal pain - POCT urinalysis dipstick - POCT Microscopic Urinalysis ([facility]) - DG Abd Acute W/Chest - polyethylene glycol powder (GLYCOLAX/MIRALAX) powder; Take 17 g by mouth 2 (two) times daily as needed.  Dispense: 3350 g; Refill: 1  2. Nausea without vomiting - Comprehensive metabolic panel  3. Urinary frequency - POCT glucose (manual entry)  4. Hyperglycemia - POCT glycosylated hemoglobin (Hb A1C)  5. Carpal tunnel syndrome of right wrist - gabapentin (NEURONTIN) 300 MG capsule; Take 2 capsules (600 mg total) by mouth 3 (three) times daily.  Dispense: 180 capsule; Refill: 5  6. Eczema - clobetasol cream (TEMOVATE) 0.05 %; Apply 1 application topically 2 (  two) times daily.  Dispense: 30 g; Refill: 1  Patient educated to drink at least 64 oz of water each day and eat plenty of fiber. Also recommended to get plenty of physical activity. Discussed Dulera and Albuterol use.   Return in about 6 weeks (around 03/22/2015).

## 2015-02-09 ENCOUNTER — Encounter: Payer: Self-pay | Admitting: Physician Assistant

## 2015-02-14 ENCOUNTER — Telehealth: Payer: Self-pay

## 2015-02-14 NOTE — Telephone Encounter (Signed)
Patient requesting inflammatory tablet for carpal tunnel from Chelle.   [phone]

## 2015-02-14 NOTE — Telephone Encounter (Signed)
5. Carpal tunnel syndrome of right wrist Wear with wrist splints, at least at night. Continue gabapentin. Find out what dose her mother takes, and I will send that dose in for her. - gabapentin (NEURONTIN) 300 MG capsule; Take 1 capsule (300 mg total) by mouth 3 (three) times daily. Dispense: 90 capsule; Refill: 5  I tried to call to see what medication his mother takes. No answer.

## 2015-02-15 NOTE — Telephone Encounter (Signed)
SPoke with pt, she just picked up the Diclofenac. Advised pt this will help with symptoms.

## 2015-02-15 NOTE — Telephone Encounter (Signed)
Does she still have the diclofenac (Cataflam) prescribed at the other office?  If so, she should take that.  If not, send in: Meloxicam 15 mg. 1 PO QD. #30, RF x 2

## 2015-03-27 ENCOUNTER — Telehealth: Payer: Self-pay

## 2015-03-27 MED ORDER — DICLOFENAC POTASSIUM 50 MG PO TABS: 50.0000 mg | ORAL_TABLET | Freq: Three times a day (TID) | ORAL | Status: DC

## 2015-03-27 NOTE — Telephone Encounter (Signed)
Refill request  diclofenac (CATAFLAM) 50 MG tablet   [id number] - [place], [place] - [address]  CB #  [phone]

## 2015-03-27 NOTE — Telephone Encounter (Signed)
Meds ordered this encounter  Medications  . diclofenac (CATAFLAM) 50 MG tablet    Sig: Take 1 tablet (50 mg total) by mouth 3 (three) times daily. One tablet TID with food prn pain.    Dispense:  21 tablet    Refill:  0    Order Specific Question:  Supervising Provider    Answer:  DOOLITTLE, ROBERT P [[id number]]

## 2015-03-27 NOTE — Telephone Encounter (Signed)
Chelle do you want to RX this? It looks like you gave him gabapentin.

## 2015-07-02 ENCOUNTER — Telehealth: Payer: Self-pay

## 2015-07-02 NOTE — Telephone Encounter (Signed)
Patient is calling to requesting a note for work stating that she has asthma and cannot work in hot temperatures. Patient would like to pick it up. Please advise! Patient phone: [phone]

## 2015-07-02 NOTE — Telephone Encounter (Signed)
Is this ok? She has not been here since Jan. I am not sure if you agreed to this in the past.

## 2015-07-02 NOTE — Telephone Encounter (Signed)
She was supposed to come back to see me in early March for re-evaluation of her asthma.  We did not discuss hot temperatures, but did discuss her exposure to smoke.  Please advise her to RTC to discuss this further.

## 2015-07-03 NOTE — Telephone Encounter (Signed)
Pt advised to RTC. Pt understood. 

## 2016-06-28 ENCOUNTER — Ambulatory Visit (HOSPITAL_COMMUNITY)
Admission: EM | Admit: 2016-06-28 | Discharge: 2016-06-28 | Disposition: A | Discharge status: Home / Self Care | Payer: BLUE CROSS/BLUE SHIELD | Attending: Internal Medicine | Admitting: Internal Medicine

## 2016-06-28 ENCOUNTER — Encounter (HOSPITAL_COMMUNITY): Payer: Self-pay | Admitting: Family Medicine

## 2016-06-28 DIAGNOSIS — Z760 Encounter for issue of repeat prescription: Secondary | ICD-10-CM

## 2016-06-28 DIAGNOSIS — G5601 Carpal tunnel syndrome, right upper limb: Secondary | ICD-10-CM

## 2016-06-28 MED ORDER — METHYLPREDNISOLONE ACETATE 80 MG/ML IJ SUSP
INTRAMUSCULAR | Status: AC
  Filled 2016-06-28: qty 1

## 2016-06-28 MED ORDER — GABAPENTIN 300 MG PO CAPS: 600.0000 mg | ORAL_CAPSULE | Freq: Three times a day (TID) | ORAL | 5 refills | Status: DC

## 2016-06-28 MED ORDER — METHYLPREDNISOLONE ACETATE 80 MG/ML IJ SUSP
80.0000 mg | Freq: Once | INTRAMUSCULAR | Status: AC
  Administered 2016-06-28: 80 mg via INTRAMUSCULAR

## 2016-06-28 MED ORDER — DICLOFENAC POTASSIUM 50 MG PO TABS: 50.0000 mg | ORAL_TABLET | Freq: Three times a day (TID) | ORAL | 2 refills | Status: DC

## 2016-06-28 MED ORDER — KETOROLAC TROMETHAMINE 60 MG/2ML IM SOLN
60.0000 mg | Freq: Once | INTRAMUSCULAR | Status: AC
  Administered 2016-06-28: 60 mg via INTRAMUSCULAR

## 2016-06-28 MED ORDER — KETOROLAC TROMETHAMINE 60 MG/2ML IM SOLN
INTRAMUSCULAR | Status: AC
  Filled 2016-06-28: qty 2

## 2016-06-28 NOTE — Discharge Instructions (Signed)
I have refilled your medications. You've been given an injection of Toradol, and Depo-Medrol, and a wrist splint. If your pain persists, or fails to respond, I recommend following up with an orthopedist.

## 2016-06-28 NOTE — ED Provider Notes (Signed)
CSN: [id number]     Arrival date & time 06/28/16  1509 History   First MD Initiated Contact with Patient 06/28/16 1521     Chief Complaint  Patient presents with  . Hand Pain   (Consider location/radiation/quality/duration/timing/severity/associated sxs/prior Treatment) 31 year old female presents to clinic with a chief complaint of carpal tunnel syndrome, states she is out of her pain medication, diclofenac, and Flexeril, and is been unable to get in with her primary care provider for refills.   The history is provided by the patient.  Medication Refill  Medications/supplies requested:  Diclofenac and Gabapentin Medications taken before: yes - see home medications   Patient has complete original prescription information: no   Source of information:  Clinic/provider (EPIC notes)   Past Medical History:  Diagnosis Date  . Arthritis   . Asthma   . Eczema    Past Surgical History:  Procedure Laterality Date  . TYMPANOSTOMY TUBE PLACEMENT Bilateral    History reviewed. No pertinent family history. Social History  Substance Use Topics  . Smoking status: Current Every Day Smoker    Packs/day: 0.25    Types: Cigarettes  . Smokeless tobacco: Never Used  . Alcohol use No   OB History    No data available     Review of Systems  Constitutional: Negative.   HENT: Negative.   Respiratory: Negative.   Cardiovascular: Negative.   Gastrointestinal: Negative.   Musculoskeletal: Positive for joint swelling.  Skin: Negative.   Neurological: Negative.     Allergies  Patient has no known allergies.  Home Medications   Prior to Admission medications   Medication Sig Start Date End Date Taking? Authorizing Provider  albuterol (PROVENTIL HFA;VENTOLIN HFA) 108 (90 Base) MCG/ACT inhaler Inhale 1-2 puffs into the lungs every 4 (four) hours as needed for wheezing or shortness of breath. 01/24/15   Porfirio OarJeffery, Chelle, PA-C  clobetasol cream (TEMOVATE) 0.05 % Apply 1 application topically 2  (two) times daily. 02/08/15   Porfirio OarJeffery, Chelle, PA-C  diclofenac (CATAFLAM) 50 MG tablet Take 1 tablet (50 mg total) by mouth 3 (three) times daily. One tablet TID with food prn pain. 06/28/16   Dorena BodoKennard, Kashif Pooler, NP  fexofenadine (ALLEGRA) 180 MG tablet Take 1 tablet (180 mg total) by mouth daily. 05/21/14   Sherren MochaShaw, Eva N, MD  gabapentin (NEURONTIN) 300 MG capsule Take 2 capsules (600 mg total) by mouth 3 (three) times daily. 06/28/16   Dorena BodoKennard, Arvind Mexicano, NP  mometasone-formoterol (DULERA) 200-5 MCG/ACT AERO Inhale 2 puffs into the lungs 2 (two) times daily. 01/24/15   Jeffery, Chelle, PA-C  polyethylene glycol powder (GLYCOLAX/MIRALAX) powder Take 17 g by mouth 2 (two) times daily as needed. 02/08/15   Porfirio OarJeffery, Chelle, PA-C   Meds Ordered and Administered this Visit   Medications  methylPREDNISolone acetate (DEPO-MEDROL) injection 80 mg (not administered)  ketorolac (TORADOL) injection 60 mg (60 mg Intramuscular Given 06/28/16 1541)    BP 115/85 (BP Location: Right Arm)   Pulse 89   Temp 99.2 F (37.3 C) (Oral)   Resp 16   SpO2 100%  No data found.   Physical Exam  Constitutional: She is oriented to person, place, and time. She appears well-developed and well-nourished. No distress.  HENT:  Head: Normocephalic.  Right Ear: External ear normal.  Left Ear: External ear normal.  Eyes: Conjunctivae are normal.  Neck: Normal range of motion.  Musculoskeletal:       Right wrist: She exhibits tenderness and swelling. She exhibits no deformity.  Neurological: She  is alert and oriented to person, place, and time.  Skin: Skin is warm and dry. Capillary refill takes less than 2 seconds. She is not diaphoretic.  Psychiatric: She has a normal mood and affect. Her behavior is normal.  Nursing note and vitals reviewed.   Urgent Care Course     Procedures (including critical care time)  Labs Review Labs Reviewed - No data to display  Imaging Review No results found.     MDM   1. Medication  refill   2. Carpal tunnel syndrome of right wrist    Patient given injection of Toradol, Depo-Medrol, and her wrist was splinted in clinic. Work note provided, and medicines were refilled. Recommend following up with orthopedics for further evaluation, and management of her pain.    Dorena Bodo, NP 06/28/16 1547

## 2016-06-28 NOTE — ED Triage Notes (Signed)
Pt here for carpal tunnel flare up x 2 weeks. sts she is out of gabapentin and anti inflammatories.

## 2016-08-18 ENCOUNTER — Emergency Department (HOSPITAL_COMMUNITY): Payer: Self-pay

## 2016-08-18 ENCOUNTER — Emergency Department (HOSPITAL_COMMUNITY)
Admission: EM | Admit: 2016-08-18 | Discharge: 2016-08-18 | Disposition: A | Discharge status: Home / Self Care | Payer: Self-pay | Attending: Emergency Medicine | Admitting: Emergency Medicine

## 2016-08-18 ENCOUNTER — Encounter (HOSPITAL_COMMUNITY): Payer: Self-pay | Admitting: *Deleted

## 2016-08-18 DIAGNOSIS — J4521 Mild intermittent asthma with (acute) exacerbation: Secondary | ICD-10-CM | POA: Insufficient documentation

## 2016-08-18 DIAGNOSIS — Z79899 Other long term (current) drug therapy: Secondary | ICD-10-CM | POA: Insufficient documentation

## 2016-08-18 DIAGNOSIS — F17210 Nicotine dependence, cigarettes, uncomplicated: Secondary | ICD-10-CM | POA: Insufficient documentation

## 2016-08-18 MED ORDER — ALBUTEROL SULFATE HFA 108 (90 BASE) MCG/ACT IN AERS
2.0000 | INHALATION_SPRAY | Freq: Once | RESPIRATORY_TRACT | Status: AC
  Administered 2016-08-18: 2 via RESPIRATORY_TRACT
  Filled 2016-08-18: qty 6.7

## 2016-08-18 MED ORDER — ALBUTEROL SULFATE (2.5 MG/3ML) 0.083% IN NEBU
5.0000 mg | INHALATION_SOLUTION | Freq: Once | RESPIRATORY_TRACT | Status: AC
  Administered 2016-08-18: 5 mg via RESPIRATORY_TRACT
  Filled 2016-08-18: qty 6

## 2016-08-18 MED ORDER — ALBUTEROL SULFATE HFA 108 (90 BASE) MCG/ACT IN AERS: 2.0000 | INHALATION_SPRAY | RESPIRATORY_TRACT | 0 refills | Status: DC | PRN

## 2016-08-18 NOTE — Discharge Instructions (Signed)
Use inhaler 2 puffs every 4 hrs as needed. Follow up with a family doctor. Continue to cut down on smoking. Return if worsening.

## 2016-08-18 NOTE — ED Triage Notes (Signed)
Pt reports she feels like her "asthma is starting to act up" and she needs a breathing treatment. She sts she doesn't have an inhaler at home anymore, and usually when she develops the symptoms she just come to ER for breathing treatment. Pt ambulatory in the triage, speaking in full sentences.

## 2016-08-18 NOTE — ED Provider Notes (Signed)
[facility]-EMERGENCY DEPT Provider Note   CSN: [id number] Arrival date & time: 08/18/16  0619     History   Chief Complaint Chief Complaint  Patient presents with  . Asthma    HPI Karen Jensen is a 31 y.o. female.  HPI Karen Jensen is a 31 y.o. female presents to emergency department complaining of wheezing. Patient states her symptoms began overnight and got worse this morning. She states she does have history of asthma and normally would use her inhaler but she did not have one. She reports dry nonproductive cough. Denies any chest pain or shortness of breath. States "I just need a breathing treatment and a work note." Denies any recent illnesses. No recent travel or surgeries. No swelling in extremities. No fever or chills. History of similar symptoms in the past.  Past Medical History:  Diagnosis Date  . Arthritis   . Asthma   . Eczema     Patient Active Problem List   Diagnosis Date Noted  . Migraine 01/24/2015  . Asthma, chronic 01/24/2015  . Carpal tunnel syndrome 01/24/2015    Past Surgical History:  Procedure Laterality Date  . TYMPANOSTOMY TUBE PLACEMENT Bilateral     OB History    No data available       Home Medications    Prior to Admission medications   Medication Sig Start Date End Date Taking? Authorizing Provider  albuterol (PROVENTIL HFA;VENTOLIN HFA) 108 (90 Base) MCG/ACT inhaler Inhale 1-2 puffs into the lungs every 4 (four) hours as needed for wheezing or shortness of breath. 01/24/15   Porfirio OarJeffery, Chelle, PA-C  clobetasol cream (TEMOVATE) 0.05 % Apply 1 application topically 2 (two) times daily. 02/08/15   Porfirio OarJeffery, Chelle, PA-C  diclofenac (CATAFLAM) 50 MG tablet Take 1 tablet (50 mg total) by mouth 3 (three) times daily. One tablet TID with food prn pain. 06/28/16   Dorena BodoKennard, Lawrence, NP  fexofenadine (ALLEGRA) 180 MG tablet Take 1 tablet (180 mg total) by mouth daily. 05/21/14   Sherren MochaShaw, Eva N, MD  gabapentin (NEURONTIN) 300 MG capsule Take  2 capsules (600 mg total) by mouth 3 (three) times daily. 06/28/16   Dorena BodoKennard, Lawrence, NP  mometasone-formoterol (DULERA) 200-5 MCG/ACT AERO Inhale 2 puffs into the lungs 2 (two) times daily. 01/24/15   Jeffery, Chelle, PA-C  polyethylene glycol powder (GLYCOLAX/MIRALAX) powder Take 17 g by mouth 2 (two) times daily as needed. 02/08/15   Porfirio OarJeffery, Chelle, PA-C    Family History No family history on file.  Social History Social History  Substance Use Topics  . Smoking status: Current Every Day Smoker    Packs/day: 0.25    Types: Cigarettes  . Smokeless tobacco: Never Used  . Alcohol use No     Allergies   Patient has no known allergies.   Review of Systems Review of Systems  Constitutional: Negative for chills and fever.  Respiratory: Positive for cough and wheezing. Negative for chest tightness and shortness of breath.   Cardiovascular: Negative for chest pain, palpitations and leg swelling.  Gastrointestinal: Negative for abdominal pain, diarrhea, nausea and vomiting.  Genitourinary: Negative for dysuria, flank pain and pelvic pain.  Musculoskeletal: Negative for arthralgias, myalgias, neck pain and neck stiffness.  Skin: Negative for rash.  Neurological: Negative for dizziness, weakness and headaches.  All other systems reviewed and are negative.    Physical Exam Updated Vital Signs BP 109/87 (BP Location: Right Arm)   Pulse (!) 51   Temp 98 F (36.7 C)   Resp  15   LMP 08/11/2016 (Approximate)   SpO2 93%   Physical Exam  Constitutional: She is oriented to person, place, and time. She appears well-developed and well-nourished. No distress.  HENT:  Head: Normocephalic and atraumatic.  Eyes: Pupils are equal, round, and reactive to light. Conjunctivae and EOM are normal.  Neck: Normal range of motion. Neck supple.  Cardiovascular: Normal rate, regular rhythm and normal heart sounds.   Pulmonary/Chest: Effort normal and breath sounds normal. No respiratory distress. She  has no wheezes. She has no rales.  Musculoskeletal: Normal range of motion. She exhibits no edema.  Neurological: She is alert and oriented to person, place, and time. No cranial nerve deficit. Coordination normal.  Skin: Skin is warm and dry.  Psychiatric: She has a normal mood and affect. Her behavior is normal.  Nursing note and vitals reviewed.    ED Treatments / Results  Labs (all labs ordered are listed, but only abnormal results are displayed) Labs Reviewed - No data to display  EKG  EKG Interpretation  Date/Time:  Monday August 18 2016 06:24:49 EDT Ventricular Rate:  65 PR Interval:    QRS Duration: 93 QT Interval:  408 QTC Calculation: 425 R Axis:   30 Text Interpretation:  Sinus arrhythmia Low voltage, precordial leads No old tracing to compare Confirmed by Devoria AlbeKnapp, Iva ([id number]) on 08/18/2016 6:40:41 AM       Radiology No results found.  Procedures Procedures (including critical care time)  Medications Ordered in ED Medications  albuterol (PROVENTIL HFA;VENTOLIN HFA) 108 (90 Base) MCG/ACT inhaler 2 puff (not administered)  albuterol (PROVENTIL) (2.5 MG/3ML) 0.083% nebulizer solution 5 mg (5 mg Nebulization Given 08/18/16 0640)     Initial Impression / Assessment and Plan / ED Course  I have reviewed the triage vital signs and the nursing notes.  Pertinent labs & imaging results that were available during my care of the patient were reviewed by me and considered in my medical decision making (see chart for details).     Patient in emergency department with asthma exacerbation. Patient reports wheezing and dry cough onset this morning. Patient received a breathing treatment upon arrival to the ED. On my exam, lungs are clear, no wheezing. She denies any chest pain, no shortness of breath. No productive cough. No fever. I do not think patient needs any further imaging. She states she feels much better after breathing treatment and would like to go home with an inhaler  and a work note. Will discharge home, inhaler provided, will give additional prescription for a refill, and instructed to follow with the family doctor. Discussed stopping smoking. Return precautions discussed  Vitals:   08/18/16 0625 08/18/16 0642 08/18/16 0645  BP: (!) 139/107 109/87   Pulse: 73 (!) 51   Resp: 13 15   Temp: 98 F (36.7 C)    SpO2: 100% 100% 93%     Final Clinical Impressions(s) / ED Diagnoses   Final diagnoses:  Mild intermittent asthma with exacerbation    New Prescriptions New Prescriptions   ALBUTEROL (PROVENTIL HFA;VENTOLIN HFA) 108 (90 BASE) MCG/ACT INHALER    Inhale 2 puffs into the lungs every 4 (four) hours as needed for wheezing or shortness of breath.     Jaynie CrumbleKirichenko, Sandeep Delagarza, PA-C 08/18/16 [phone]    Devoria AlbeKnapp, Iva, MD 08/18/16 [phone]

## 2016-08-18 NOTE — ED Notes (Signed)
REFUSED CHEST XRAY

## 2016-09-30 ENCOUNTER — Telehealth: Payer: Self-pay | Admitting: Physician Assistant
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
## 2016-10-01 ENCOUNTER — Other Ambulatory Visit: Payer: Self-pay

## 2016-10-01 DIAGNOSIS — J45909 Unspecified asthma, uncomplicated: Secondary | ICD-10-CM

## 2016-10-01 MED ORDER — ALBUTEROL SULFATE HFA 108 (90 BASE) MCG/ACT IN AERS: 1.0000 | INHALATION_SPRAY | RESPIRATORY_TRACT | 0 refills | Status: DC | PRN

## 2016-10-01 NOTE — Telephone Encounter (Signed)
Rx refilled but Pt needs OV for any further refills

## 2016-12-10 ENCOUNTER — Telehealth (HOSPITAL_COMMUNITY): Payer: Self-pay

## 2016-12-10 NOTE — Telephone Encounter (Unsigned)
Copied from CRM [phone]#[id number]. Topic: Inquiry >> Dec 10, 2016  8:35 AM Anice PaganiniMunoz, Cecilee Rosner I, NT wrote: Reason for CRM:pt Call for Rx Refill for Albuterol Inhaler she is of both of Her Inhaler.Thanks  >> Dec 10, 2016  8:50 AM Anice PaganiniMunoz, Apollos Tenbrink I, NT wrote: Pt use [facility] on [address] [phone]

## 2016-12-12 ENCOUNTER — Other Ambulatory Visit: Payer: Self-pay

## 2016-12-12 DIAGNOSIS — J45909 Unspecified asthma, uncomplicated: Secondary | ICD-10-CM

## 2016-12-12 MED ORDER — MOMETASONE FURO-FORMOTEROL FUM 200-5 MCG/ACT IN AERO: 2.0000 | INHALATION_SPRAY | Freq: Two times a day (BID) | RESPIRATORY_TRACT | 5 refills | Status: DC

## 2016-12-12 MED ORDER — ALBUTEROL SULFATE HFA 108 (90 BASE) MCG/ACT IN AERS: 1.0000 | INHALATION_SPRAY | RESPIRATORY_TRACT | 0 refills | Status: DC | PRN

## 2016-12-12 NOTE — Telephone Encounter (Signed)
Rx sent in

## 2017-01-29 ENCOUNTER — Encounter (HOSPITAL_COMMUNITY): Payer: Self-pay | Admitting: Emergency Medicine

## 2017-01-29 ENCOUNTER — Other Ambulatory Visit: Payer: Self-pay

## 2017-01-29 ENCOUNTER — Emergency Department (HOSPITAL_COMMUNITY)
Admission: EM | Admit: 2017-01-29 | Discharge: 2017-01-29 | Disposition: A | Discharge status: Home / Self Care | Payer: Self-pay | Attending: Emergency Medicine | Admitting: Emergency Medicine

## 2017-01-29 DIAGNOSIS — F17210 Nicotine dependence, cigarettes, uncomplicated: Secondary | ICD-10-CM | POA: Insufficient documentation

## 2017-01-29 DIAGNOSIS — M25512 Pain in left shoulder: Secondary | ICD-10-CM | POA: Insufficient documentation

## 2017-01-29 DIAGNOSIS — Z79899 Other long term (current) drug therapy: Secondary | ICD-10-CM | POA: Insufficient documentation

## 2017-01-29 DIAGNOSIS — J4521 Mild intermittent asthma with (acute) exacerbation: Secondary | ICD-10-CM | POA: Insufficient documentation

## 2017-01-29 MED ORDER — ALBUTEROL SULFATE HFA 108 (90 BASE) MCG/ACT IN AERS
1.0000 | INHALATION_SPRAY | RESPIRATORY_TRACT | Status: DC | PRN
  Administered 2017-01-29: 2 via RESPIRATORY_TRACT
  Filled 2017-01-29: qty 6.7

## 2017-01-29 MED ORDER — IPRATROPIUM-ALBUTEROL 0.5-2.5 (3) MG/3ML IN SOLN
3.0000 mL | Freq: Once | RESPIRATORY_TRACT | Status: AC
  Administered 2017-01-29: 3 mL via RESPIRATORY_TRACT
  Filled 2017-01-29: qty 3

## 2017-01-29 MED ORDER — CYCLOBENZAPRINE HCL 10 MG PO TABS: 10.0000 mg | ORAL_TABLET | Freq: Every evening | ORAL | 0 refills | Status: DC | PRN
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
## 2017-01-29 NOTE — Discharge Instructions (Signed)
Take Ibuprofen three times daily for the next week. Take this medicine with food. °Take muscle relaxer at bedtime to help you sleep. This medicine makes you drowsy so do not take before driving or work °Use a heating pad for sore muscles - use for 20 minutes several times a day °Return for worsening symptoms ° °
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
## 2017-04-23 ENCOUNTER — Encounter: Payer: Self-pay | Admitting: Physician Assistant

## 2017-06-09 ENCOUNTER — Other Ambulatory Visit: Payer: Self-pay

## 2017-06-09 ENCOUNTER — Emergency Department (HOSPITAL_COMMUNITY)
Admission: EM | Admit: 2017-06-09 | Discharge: 2017-06-09 | Disposition: A | Discharge status: Home / Self Care | Payer: Self-pay | Attending: Emergency Medicine | Admitting: Emergency Medicine

## 2017-06-09 ENCOUNTER — Encounter (HOSPITAL_COMMUNITY): Payer: Self-pay | Admitting: *Deleted

## 2017-06-09 DIAGNOSIS — F17210 Nicotine dependence, cigarettes, uncomplicated: Secondary | ICD-10-CM | POA: Insufficient documentation

## 2017-06-09 DIAGNOSIS — J302 Other seasonal allergic rhinitis: Secondary | ICD-10-CM | POA: Insufficient documentation

## 2017-06-09 DIAGNOSIS — Z79899 Other long term (current) drug therapy: Secondary | ICD-10-CM | POA: Insufficient documentation

## 2017-06-09 DIAGNOSIS — J45909 Unspecified asthma, uncomplicated: Secondary | ICD-10-CM | POA: Insufficient documentation

## 2017-06-09 MED ORDER — ALBUTEROL SULFATE HFA 108 (90 BASE) MCG/ACT IN AERS
1.0000 | INHALATION_SPRAY | Freq: Once | RESPIRATORY_TRACT | Status: AC | PRN
  Administered 2017-06-09: 2 via RESPIRATORY_TRACT
  Filled 2017-06-09: qty 6.7

## 2017-06-09 NOTE — ED Provider Notes (Signed)
[facility]-EMERGENCY DEPT Provider Note   CSN: [id number] Arrival date & time: 06/09/17  1517     History   Chief Complaint Chief Complaint  Patient presents with  . Hearing Loss    L ear    HPI Karen Jensen is a 32 y.o. female with past medical history of seasonal allergies, asthma, presenting to the ED with 3 weeks of nasal congestion and muffled hearing in left ear.  She states sounds are muffled, though denies pain or drainage to the ear.  She states she has seasonal allergies every year, and they have been persistent for 3 weeks.  She has been intermittently taking daytime allergy medication.  Denies sore throat, fever, cough, difficulty breathing or other complaints.  The history is provided by the patient.    Past Medical History:  Diagnosis Date  . Arthritis   . Asthma   . Eczema     Patient Active Problem List   Diagnosis Date Noted  . Migraine 01/24/2015  . Asthma, chronic 01/24/2015  . Carpal tunnel syndrome 01/24/2015    Past Surgical History:  Procedure Laterality Date  . TYMPANOSTOMY TUBE PLACEMENT Bilateral      OB History   None      Home Medications    Prior to Admission medications   Medication Sig Start Date End Date Taking? Authorizing Provider  albuterol (PROVENTIL HFA;VENTOLIN HFA) 108 (90 Base) MCG/ACT inhaler Inhale 2 puffs into the lungs every 4 (four) hours as needed for wheezing or shortness of breath. 08/18/16   Kirichenko, Lemont Fillers, PA-C  albuterol (PROVENTIL HFA;VENTOLIN HFA) 108 (90 Base) MCG/ACT inhaler Inhale 1-2 puffs into the lungs every 4 (four) hours as needed for wheezing or shortness of breath. 12/12/16   Porfirio Oar, PA-C  clobetasol cream (TEMOVATE) 0.05 % Apply 1 application topically 2 (two) times daily. 02/08/15   Porfirio Oar, PA-C  cyclobenzaprine (FLEXERIL) 10 MG tablet Take 1 tablet (10 mg total) by mouth at bedtime and may repeat dose one time if needed. 01/29/17   Bethel Born, PA-C  diclofenac (CATAFLAM) 50 MG tablet Take 1 tablet (50 mg total) by mouth 3 (three) times daily. One tablet TID with food prn pain. 06/28/16   Dorena Bodo, NP  fexofenadine (ALLEGRA) 180 MG tablet Take 1 tablet (180 mg total) by mouth daily. 05/21/14   Sherren Mocha, MD  gabapentin (NEURONTIN) 300 MG capsule Take 2 capsules (600 mg total) by mouth 3 (three) times daily. 06/28/16   Dorena Bodo, NP  mometasone-formoterol (DULERA) 200-5 MCG/ACT AERO Inhale 2 puffs into the lungs 2 (two) times daily. 12/12/16   Jeffery, Chelle, PA-C  polyethylene glycol powder (GLYCOLAX/MIRALAX) powder Take 17 g by mouth 2 (two) times daily as needed. 02/08/15   Porfirio Oar, PA-C    Family History No family history on file.  Social History Social History   Tobacco Use  . Smoking status: Current Every Day Smoker    Packs/day: 0.25    Types: Cigarettes  . Smokeless tobacco: Never Used  Substance Use Topics  . Alcohol use: No    Alcohol/week: 0.0 oz  . Drug use: Yes    Frequency: 7.0 times per week    Types: Marijuana     Allergies   Patient has no known allergies.   Review of Systems Review of Systems  Constitutional: Negative for fever.  HENT: Positive for congestion, sinus pressure and sneezing. Negative for ear discharge, ear pain, sinus pain and sore throat.  Respiratory: Negative for cough and shortness of breath.      Physical Exam Updated Vital Signs BP (!) 131/100 (BP Location: Left Arm)   Pulse 92   Temp 98.8 F (37.1 C) (Oral)   Resp 18   Ht 5' 0.25" (1.53 m)   Wt 98.4 kg (217 lb)   SpO2 100%   BMI 42.03 kg/m   Physical Exam  Constitutional: She appears well-developed and well-nourished. No distress.  HENT:  Head: Normocephalic and atraumatic.  Right Ear: Tympanic membrane and ear canal normal.  Left Ear: Ear canal normal. Tympanic membrane is retracted. Tympanic membrane is not perforated and not erythematous.  No middle ear effusion.  Mouth/Throat:  Oropharynx is clear and moist.  Eyes: Conjunctivae are normal.  Neck: Normal range of motion. Neck supple.  Cardiovascular: Normal rate, regular rhythm and intact distal pulses.  Pulmonary/Chest: Effort normal and breath sounds normal. No respiratory distress.  Lymphadenopathy:    She has no cervical adenopathy.  Psychiatric: She has a normal mood and affect. Her behavior is normal.  Nursing note and vitals reviewed.    ED Treatments / Results  Labs (all labs ordered are listed, but only abnormal results are displayed) Labs Reviewed - No data to display  EKG None  Radiology No results found.  Procedures Procedures (including critical care time)  Medications Ordered in ED Medications  albuterol (PROVENTIL HFA;VENTOLIN HFA) 108 (90 Base) MCG/ACT inhaler 1-2 puff (has no administration in time range)     Initial Impression / Assessment and Plan / ED Course  I have reviewed the triage vital signs and the nursing notes.  Pertinent labs & imaging results that were available during my care of the patient were reviewed by me and considered in my medical decision making (see chart for details).   Patient with symptoms consistent with seasonal allergies.  Patient has nasal congestion and sinus pressure, though no sinus pain.  Left TM is retracted, though not erythematous and without effusion.  Suspect some eustachian tube dysfunction secondary to congestion.  Discussed symptomatic management including nasal sprays and allergy medications.  PCP follow-up as needed.  Safe for discharge.  Discussed results, findings, treatment and follow up. Patient advised of return precautions. Patient verbalized understanding and agreed with plan.   Final Clinical Impressions(s) / ED Diagnoses   Final diagnoses:  Seasonal allergic rhinitis, unspecified trigger    ED Discharge Orders    None       Robinson, [place] N, PA-C 06/09/17 1607    Pricilla Loveless, MD 06/09/17 2337

## 2017-06-09 NOTE — Discharge Instructions (Signed)
Continue taking allergy medicine daily. Use a steroid nasal spray, such as Flonase or Rhinocort to help the swelling in your sinuses and improve drainage. Drink plenty of water. Follow-up with your primary care provider if symptoms persist.

## 2017-06-09 NOTE — ED Triage Notes (Signed)
Pt states for last 3 weeks she has not been able to hear out of left ear, she does report sinus allergies and thought that was the problem.

## 2017-10-21 ENCOUNTER — Emergency Department (HOSPITAL_COMMUNITY): Payer: Self-pay

## 2017-10-21 ENCOUNTER — Encounter (HOSPITAL_COMMUNITY): Payer: Self-pay

## 2017-10-21 ENCOUNTER — Emergency Department (HOSPITAL_COMMUNITY)
Admission: EM | Admit: 2017-10-21 | Discharge: 2017-10-21 | Disposition: A | Discharge status: Home / Self Care | Payer: Self-pay | Attending: Emergency Medicine | Admitting: Emergency Medicine

## 2017-10-21 DIAGNOSIS — Y92008 Other place in unspecified non-institutional (private) residence as the place of occurrence of the external cause: Secondary | ICD-10-CM | POA: Insufficient documentation

## 2017-10-21 DIAGNOSIS — J45909 Unspecified asthma, uncomplicated: Secondary | ICD-10-CM | POA: Insufficient documentation

## 2017-10-21 DIAGNOSIS — X509XXA Other and unspecified overexertion or strenuous movements or postures, initial encounter: Secondary | ICD-10-CM | POA: Insufficient documentation

## 2017-10-21 DIAGNOSIS — Y939 Activity, unspecified: Secondary | ICD-10-CM | POA: Insufficient documentation

## 2017-10-21 DIAGNOSIS — S93402A Sprain of unspecified ligament of left ankle, initial encounter: Secondary | ICD-10-CM | POA: Insufficient documentation

## 2017-10-21 DIAGNOSIS — F17210 Nicotine dependence, cigarettes, uncomplicated: Secondary | ICD-10-CM | POA: Insufficient documentation

## 2017-10-21 DIAGNOSIS — Y999 Unspecified external cause status: Secondary | ICD-10-CM | POA: Insufficient documentation

## 2017-10-21 DIAGNOSIS — Z79899 Other long term (current) drug therapy: Secondary | ICD-10-CM | POA: Insufficient documentation

## 2017-10-21 MED ORDER — ACETAMINOPHEN-CODEINE #3 300-30 MG PO TABS: 1.0000 | ORAL_TABLET | Freq: Four times a day (QID) | ORAL | 0 refills | Status: DC | PRN

## 2017-10-21 NOTE — Discharge Instructions (Addendum)
X-ray was reassuring.  No broken bones.    Please use the ice pack given to you in the ER over the ankle at least twice a day for 15 minutes at a time.  Take 500 mg Aleve twice a day for pain.  I have also written you a prescription for Tylenol 3.  Over this medicine can make you drowsy so please do not drive, work or drink alcohol taking it.   Use the crutches for the next 48 to 72 hours.  After this point you can start trying to apply weight to your foot.  Follow-up with your regular doctor in a week if your symptoms are not improving.

## 2017-10-21 NOTE — ED Provider Notes (Signed)
[facility]-EMERGENCY DEPT Provider Note   CSN: [id number] Arrival date & time: 10/21/17  2041     History   Chief Complaint Chief Complaint  Patient presents with  . Ankle Pain    HPI Karen Jensen is a 32 y.o. female.  HPI   Karen Jensen is a 32yo female with no significant past medical history who presents to the emergency department for evaluation of left ankle injury.  Patient reports that she was taking out the trash and accidentally twisted her ankle earlier this evening.  She reports that she has pain and swelling over the lateral malleolus.  Pain is a 6/10 and aching in severity, worsened with ambulation.  She tried taking some Aleve which is helped somewhat.  She denies numbness, weakness, injury elsewhere.  Did not hit her head or fall to the ground.  Past Medical History:  Diagnosis Date  . Arthritis   . Asthma   . Eczema     Patient Active Problem List   Diagnosis Date Noted  . Migraine 01/24/2015  . Asthma, chronic 01/24/2015  . Carpal tunnel syndrome 01/24/2015    Past Surgical History:  Procedure Laterality Date  . TYMPANOSTOMY TUBE PLACEMENT Bilateral      OB History   None      Home Medications    Prior to Admission medications   Medication Sig Start Date End Date Taking? Authorizing Provider  albuterol (PROVENTIL HFA;VENTOLIN HFA) 108 (90 Base) MCG/ACT inhaler Inhale 2 puffs into the lungs every 4 (four) hours as needed for wheezing or shortness of breath. 08/18/16   Kirichenko, Lemont Fillers, PA-C  albuterol (PROVENTIL HFA;VENTOLIN HFA) 108 (90 Base) MCG/ACT inhaler Inhale 1-2 puffs into the lungs every 4 (four) hours as needed for wheezing or shortness of breath. 12/12/16   Porfirio Oar, PA  clobetasol cream (TEMOVATE) 0.05 % Apply 1 application topically 2 (two) times daily. 02/08/15   Porfirio Oar, PA  cyclobenzaprine (FLEXERIL) 10 MG tablet Take 1 tablet (10 mg total) by mouth at bedtime and may repeat  dose one time if needed. 01/29/17   Bethel Born, PA-C  diclofenac (CATAFLAM) 50 MG tablet Take 1 tablet (50 mg total) by mouth 3 (three) times daily. One tablet TID with food prn pain. 06/28/16   Dorena Bodo, NP  fexofenadine (ALLEGRA) 180 MG tablet Take 1 tablet (180 mg total) by mouth daily. 05/21/14   Sherren Mocha, MD  gabapentin (NEURONTIN) 300 MG capsule Take 2 capsules (600 mg total) by mouth 3 (three) times daily. 06/28/16   Dorena Bodo, NP  mometasone-formoterol (DULERA) 200-5 MCG/ACT AERO Inhale 2 puffs into the lungs 2 (two) times daily. 12/12/16   Porfirio Oar, PA  polyethylene glycol powder (GLYCOLAX/MIRALAX) powder Take 17 g by mouth 2 (two) times daily as needed. 02/08/15   Porfirio Oar, PA    Family History History reviewed. No pertinent family history.  Social History Social History   Tobacco Use  . Smoking status: Current Every Day Smoker    Packs/day: 0.25    Types: Cigarettes  . Smokeless tobacco: Never Used  Substance Use Topics  . Alcohol use: No    Alcohol/week: 0.0 standard drinks  . Drug use: Yes    Frequency: 7.0 times per week    Types: Marijuana     Allergies   Patient has no known allergies.   Review of Systems Review of Systems  Constitutional: Negative for chills and fever.  Musculoskeletal: Positive for arthralgias (left ankle).  Skin: Negative for color change and wound.  Neurological: Negative for weakness and numbness.     Physical Exam Updated Vital Signs BP 120/81 (BP Location: Left Arm)   Pulse (!) 107   Temp 99.3 F (37.4 C) (Oral)   Resp 18   LMP 10/21/2017   SpO2 100%   Physical Exam  Constitutional: She is oriented to person, place, and time. She appears well-developed and well-nourished. No distress.  HENT:  Head: Normocephalic and atraumatic.  Eyes: Right eye exhibits no discharge. Left eye exhibits no discharge.  Pulmonary/Chest: Effort normal. No respiratory distress.  Musculoskeletal:  Lateral  malleolus of left ankle appears mildly swollen.  There is overlying tenderness.  No erythema, break in skin or ecchymosis.  Full active range of motion of the ankle, although painful with ankle inversion.  Achilles intact.  DP pulses 2+ bilaterally.  Sensation to light touch intact distal to the injury.  Neurological: She is alert and oriented to person, place, and time. Coordination normal.  Skin: Skin is warm and dry. Capillary refill takes less than 2 seconds. She is not diaphoretic.  Psychiatric: She has a normal mood and affect. Her behavior is normal.  Nursing note and vitals reviewed.    ED Treatments / Results  Labs (all labs ordered are listed, but only abnormal results are displayed) Labs Reviewed - No data to display  EKG None  Radiology Dg Ankle Complete Left  Result Date: 10/21/2017 CLINICAL DATA:  Pain after trauma EXAM: LEFT ANKLE COMPLETE - 3+ VIEW COMPARISON:  None. FINDINGS: There is no evidence of fracture, dislocation, or joint effusion. There is no evidence of arthropathy or other focal bone abnormality. Soft tissues are unremarkable. IMPRESSION: Negative. Electronically Signed   By: Gerome Sam III M.D   On: 10/21/2017 21:18    Procedures Procedures (including critical care time)  Medications Ordered in ED Medications - No data to display   Initial Impression / Assessment and Plan / ED Course  I have reviewed the triage vital signs and the nursing notes.  Pertinent labs & imaging results that were available during my care of the patient were reviewed by me and considered in my medical decision making (see chart for details).     Exam consistent with left lateral ankle sprain.  X-ray left ankle negative for acute fracture.  Foot is neurovascularly intact.  Patient will be discharged with RICE protocol and NSAIDs for pain.  She also be discharged with ASO brace and crutches.  I have counseled her to follow-up with her regular doctor in a week if her  symptoms are not improving.  Patient agrees.  Final Clinical Impressions(s) / ED Diagnoses   Final diagnoses:  Sprain of left ankle, unspecified ligament, initial encounter    ED Discharge Orders         Ordered    acetaminophen-codeine (TYLENOL #3) 300-30 MG tablet  Every 6 hours PRN     10/21/17 2152           Kellie Shropshire, PA-C 10/21/17 2152    Terrilee Files, MD 10/22/17 [phone]

## 2017-10-21 NOTE — ED Triage Notes (Signed)
Pt was taking the trash out and her left ankle twisted from under her, she didn't trip on anything Pt is unable to put weight on it

## 2017-10-28 ENCOUNTER — Other Ambulatory Visit: Payer: Self-pay

## 2017-10-28 ENCOUNTER — Emergency Department (HOSPITAL_COMMUNITY)
Admission: EM | Admit: 2017-10-28 | Discharge: 2017-10-28 | Disposition: A | Discharge status: Home / Self Care | Payer: No Typology Code available for payment source | Attending: Emergency Medicine | Admitting: Emergency Medicine

## 2017-10-28 ENCOUNTER — Emergency Department (HOSPITAL_COMMUNITY): Payer: No Typology Code available for payment source

## 2017-10-28 ENCOUNTER — Encounter (HOSPITAL_COMMUNITY): Payer: Self-pay | Admitting: Obstetrics and Gynecology

## 2017-10-28 DIAGNOSIS — S99911A Unspecified injury of right ankle, initial encounter: Secondary | ICD-10-CM | POA: Diagnosis present

## 2017-10-28 DIAGNOSIS — F17210 Nicotine dependence, cigarettes, uncomplicated: Secondary | ICD-10-CM | POA: Diagnosis not present

## 2017-10-28 DIAGNOSIS — J45909 Unspecified asthma, uncomplicated: Secondary | ICD-10-CM | POA: Insufficient documentation

## 2017-10-28 DIAGNOSIS — S93401A Sprain of unspecified ligament of right ankle, initial encounter: Secondary | ICD-10-CM | POA: Diagnosis not present

## 2017-10-28 DIAGNOSIS — Y999 Unspecified external cause status: Secondary | ICD-10-CM | POA: Diagnosis not present

## 2017-10-28 DIAGNOSIS — Y92410 Unspecified street and highway as the place of occurrence of the external cause: Secondary | ICD-10-CM | POA: Diagnosis not present

## 2017-10-28 DIAGNOSIS — Z23 Encounter for immunization: Secondary | ICD-10-CM | POA: Diagnosis not present

## 2017-10-28 DIAGNOSIS — Z79899 Other long term (current) drug therapy: Secondary | ICD-10-CM | POA: Insufficient documentation

## 2017-10-28 DIAGNOSIS — Y9389 Activity, other specified: Secondary | ICD-10-CM | POA: Diagnosis not present

## 2017-10-28 MED ORDER — TETANUS-DIPHTHERIA TOXOIDS TD 5-2 LFU IM INJ
0.5000 mL | INJECTION | Freq: Once | INTRAMUSCULAR | Status: AC
  Administered 2017-10-28: 0.5 mL via INTRAMUSCULAR
  Filled 2017-10-28: qty 0.5

## 2017-10-28 MED ORDER — HYDROCODONE-ACETAMINOPHEN 5-325 MG PO TABS: 2.0000 | ORAL_TABLET | ORAL | 0 refills | Status: DC | PRN

## 2017-10-28 MED ORDER — HYDROCODONE-ACETAMINOPHEN 5-325 MG PO TABS
1.0000 | ORAL_TABLET | Freq: Once | ORAL | Status: AC
  Administered 2017-10-28: 1 via ORAL
  Filled 2017-10-28: qty 1
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
## 2017-10-28 NOTE — ED Notes (Signed)
Waiting for Ortho tech  ?
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
## 2017-10-28 NOTE — Discharge Instructions (Signed)
Please return for any problem.  Follow-up with orthopedics as instructed. 
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
## 2018-06-24 DIAGNOSIS — F419 Anxiety disorder, unspecified: Secondary | ICD-10-CM | POA: Insufficient documentation

## 2018-06-24 DIAGNOSIS — Z6841 Body Mass Index (BMI) 40.0 and over, adult: Secondary | ICD-10-CM | POA: Insufficient documentation

## 2018-06-24 DIAGNOSIS — J310 Chronic rhinitis: Secondary | ICD-10-CM | POA: Insufficient documentation

## 2018-07-29 ENCOUNTER — Ambulatory Visit (HOSPITAL_COMMUNITY)
Admission: EM | Admit: 2018-07-29 | Discharge: 2018-07-29 | Disposition: A | Discharge status: Home / Self Care | Payer: Self-pay | Attending: Emergency Medicine | Admitting: Emergency Medicine

## 2018-07-29 ENCOUNTER — Encounter (HOSPITAL_COMMUNITY): Payer: Self-pay | Admitting: Emergency Medicine

## 2018-07-29 DIAGNOSIS — L309 Dermatitis, unspecified: Secondary | ICD-10-CM

## 2018-07-29 MED ORDER — TRIAMCINOLONE ACETONIDE 0.1 % EX CREA: 1.0000 "application " | TOPICAL_CREAM | Freq: Two times a day (BID) | CUTANEOUS | 1 refills | Status: DC

## 2018-07-29 MED ORDER — PREDNISONE 10 MG (21) PO TBPK: ORAL_TABLET | ORAL | 0 refills | Status: DC

## 2018-07-29 NOTE — ED Provider Notes (Signed)
[facility]    CSN: [id number] Arrival date & time: 07/29/18  1316     History   Chief Complaint Chief Complaint  Patient presents with  . Finger Pain    Thumb    HPI Karen Jensen is a 33 y.o. female history of asthma and eczema presenting for right thumb dryness/pain.  Patient states this is been ongoing for the last 2 weeks.  Patient feels that it started out with eczematous eruption, also has lesion on right forearm, there is gotten worse.  Patient has been using aloe, bandage, Neosporin as well as liquid Band-Aid with minimal relief.  Patient has had clobetasol cream in the past which is alleviated these lesions, though patient does not currently have medication.  Patient endorsing pain, irritation, pruritus.  No discharge, bleeding, joint pain.  Patient states that her asthma has been well controlled: Denies cough, wheezing, shortness of breath.     Past Medical History:  Diagnosis Date  . Arthritis   . Asthma   . Eczema     Patient Active Problem List   Diagnosis Date Noted  . Migraine 01/24/2015  . Asthma, chronic 01/24/2015  . Carpal tunnel syndrome 01/24/2015    Past Surgical History:  Procedure Laterality Date  . TYMPANOSTOMY TUBE PLACEMENT Bilateral     OB History   No obstetric history on file.      Home Medications    Prior to Admission medications   Medication Sig Start Date End Date Taking? Authorizing Provider  acetaminophen-codeine (TYLENOL #3) 300-30 MG tablet Take 1 tablet by mouth every 6 (six) hours as needed for moderate pain. 10/21/17   Kellie ShropshireShrosbree, Emily J, PA-C  albuterol (PROVENTIL HFA;VENTOLIN HFA) 108 (90 Base) MCG/ACT inhaler Inhale 2 puffs into the lungs every 4 (four) hours as needed for wheezing or shortness of breath. 08/18/16   Kirichenko, Lemont Fillersatyana, PA-C  albuterol (PROVENTIL HFA;VENTOLIN HFA) 108 (90 Base) MCG/ACT inhaler Inhale 1-2 puffs into the lungs every 4 (four) hours as needed for wheezing or shortness of  breath. 12/12/16   Porfirio OarJeffery, Chelle, PA  clobetasol cream (TEMOVATE) 0.05 % Apply 1 application topically 2 (two) times daily. 02/08/15   Porfirio OarJeffery, Chelle, PA  cyclobenzaprine (FLEXERIL) 10 MG tablet Take 1 tablet (10 mg total) by mouth at bedtime and may repeat dose one time if needed. 01/29/17   Bethel BornGekas, Kelly Marie, PA-C  diclofenac (CATAFLAM) 50 MG tablet Take 1 tablet (50 mg total) by mouth 3 (three) times daily. One tablet TID with food prn pain. 06/28/16   Dorena BodoKennard, Lawrence, NP  fexofenadine (ALLEGRA) 180 MG tablet Take 1 tablet (180 mg total) by mouth daily. 05/21/14   Sherren MochaShaw, Eva N, MD  gabapentin (NEURONTIN) 300 MG capsule Take 2 capsules (600 mg total) by mouth 3 (three) times daily. 06/28/16   Dorena BodoKennard, Lawrence, NP  HYDROcodone-acetaminophen (NORCO/VICODIN) 5-325 MG tablet Take 2 tablets by mouth every 4 (four) hours as needed. 10/28/17   Wynetta FinesMessick, Peter C, MD  mometasone-formoterol (DULERA) 200-5 MCG/ACT AERO Inhale 2 puffs into the lungs 2 (two) times daily. 12/12/16   Porfirio OarJeffery, Chelle, PA  polyethylene glycol powder (GLYCOLAX/MIRALAX) powder Take 17 g by mouth 2 (two) times daily as needed. 02/08/15   Porfirio OarJeffery, Chelle, PA  predniSONE (STERAPRED UNI-PAK 21 TAB) 10 MG (21) TBPK tablet Take prednisone pack as written 07/29/18   Hall-Potvin, [place], PA-C  triamcinolone cream (KENALOG) 0.1 % Apply 1 application topically 2 (two) times daily. 07/29/18   Hall-Potvin, [place], PA-C    Family  History History reviewed. No pertinent family history.  Social History Social History   Tobacco Use  . Smoking status: Current Every Day Smoker    Packs/day: 0.25    Types: Cigarettes  . Smokeless tobacco: Never Used  Substance Use Topics  . Alcohol use: No    Alcohol/week: 0.0 standard drinks  . Drug use: Yes    Frequency: 7.0 times per week    Types: Marijuana     Allergies   Patient has no known allergies.   Review of Systems As per HPI   Physical Exam Triage Vital Signs ED Triage Vitals  Enc  Vitals Group     BP 07/29/18 1426 112/79     Pulse Rate 07/29/18 1426 92     Resp 07/29/18 1426 14     Temp 07/29/18 1426 97.6 F (36.4 C)     Temp Source 07/29/18 1426 Temporal     SpO2 07/29/18 1426 100 %     Weight --      Height --      Head Circumference --      Peak Flow --      Pain Score 07/29/18 1428 7     Pain Loc --      Pain Edu? --      Excl. in GC? --    No data found.  Updated Vital Signs BP 112/79   Pulse 92   Temp 97.6 F (36.4 C) (Temporal)   Resp 14   SpO2 100%   Visual Acuity Right Eye Distance:   Left Eye Distance:   Bilateral Distance:    Right Eye Near:   Left Eye Near:    Bilateral Near:     Physical Exam Vitals signs and nursing note reviewed.  Constitutional:      General: She is not in acute distress. HENT:     Head: Normocephalic and atraumatic.  Eyes:     General: No scleral icterus.    Pupils: Pupils are equal, round, and reactive to light.  Cardiovascular:     Rate and Rhythm: Normal rate.  Pulmonary:     Effort: Pulmonary effort is normal.  Skin:    Comments: 2 cm eczematous lesion on openings, drainage. Very dry, cracked, eczematous lesion on right distal thumb.  No nailbed involvement, discharge, warmth, tenderness to palpation.  Cap refill under 2 seconds, FROM, NVI.  Neurological:     Mental Status: She is alert and oriented to person, place, and time.      UC Treatments / Results  Labs (all labs ordered are listed, but only abnormal results are displayed) Labs Reviewed - No data to display  EKG   Radiology No results found.  Procedures Procedures (including critical care time)  Medications Ordered in UC Medications - No data to display  Initial Impression / Assessment and Plan / UC Course  I have reviewed the triage vital signs and the nursing notes.  Pertinent labs & imaging results that were available during my care of the patient were reviewed by me and considered in my medical decision making (see  chart for details).     33 year old female with history of asthma and eczema presenting for eczematous flare.  Exam is reassuring: Not concerned for secondary infection at this time.  Due to duration, severity of lesion will do steroid taper.  Cost is of concern for patient, will prescribe triamcinolone in lieu of clobetasol.  Return precautions discussed, patient verbalized understanding and is agreeable to plan. Final  Clinical Impressions(s) / UC Diagnoses   Final diagnoses:  Eczema, unspecified type     Discharge Instructions     Take steroid pack as prescribed. Keep right thumb hydrated with unscented lotion nightly.  Avoid hot water and steam as this will dry out your skin more. Return if you have worse pain, swelling, redness, pus, fever.     ED Prescriptions    Medication Sig Dispense Auth. Provider   predniSONE (STERAPRED UNI-PAK 21 TAB) 10 MG (21) TBPK tablet Take prednisone pack as written 21 tablet Hall-Potvin, [place], PA-C   triamcinolone cream (KENALOG) 0.1 % Apply 1 application topically 2 (two) times daily. 30 g Hall-Potvin, [place], PA-C     Controlled Substance Prescriptions [place] Controlled Substance Registry consulted? Not Applicable   Quincy Sheehan, [place] 07/29/18 1287

## 2018-07-29 NOTE — ED Triage Notes (Signed)
Pt c/o dry painful skin at the tip of her R thumb. States its been going on for two weeks. Has tried several OTC medicines without relief.

## 2018-07-29 NOTE — Discharge Instructions (Addendum)
Take steroid pack as prescribed. Keep right thumb hydrated with unscented lotion nightly.  Avoid hot water and steam as this will dry out your skin more. Return if you have worse pain, swelling, redness, pus, fever.

## 2018-10-06 ENCOUNTER — Other Ambulatory Visit: Payer: Self-pay

## 2018-10-06 ENCOUNTER — Ambulatory Visit (HOSPITAL_COMMUNITY)
Admission: EM | Admit: 2018-10-06 | Discharge: 2018-10-06 | Disposition: A | Discharge status: Home / Self Care | Payer: HRSA Program | Attending: Family Medicine | Admitting: Family Medicine

## 2018-10-06 ENCOUNTER — Encounter (HOSPITAL_COMMUNITY): Payer: Self-pay

## 2018-10-06 DIAGNOSIS — G5600 Carpal tunnel syndrome, unspecified upper limb: Secondary | ICD-10-CM | POA: Diagnosis not present

## 2018-10-06 DIAGNOSIS — Z0189 Encounter for other specified special examinations: Secondary | ICD-10-CM | POA: Diagnosis not present

## 2018-10-06 DIAGNOSIS — Z79899 Other long term (current) drug therapy: Secondary | ICD-10-CM | POA: Insufficient documentation

## 2018-10-06 DIAGNOSIS — J45909 Unspecified asthma, uncomplicated: Secondary | ICD-10-CM | POA: Insufficient documentation

## 2018-10-06 DIAGNOSIS — M1990 Unspecified osteoarthritis, unspecified site: Secondary | ICD-10-CM | POA: Insufficient documentation

## 2018-10-06 DIAGNOSIS — Z20828 Contact with and (suspected) exposure to other viral communicable diseases: Secondary | ICD-10-CM | POA: Insufficient documentation

## 2018-10-06 DIAGNOSIS — F17210 Nicotine dependence, cigarettes, uncomplicated: Secondary | ICD-10-CM | POA: Insufficient documentation

## 2018-10-06 NOTE — Discharge Instructions (Signed)
Self isolate until results are back and negative.  °Will notify you of any positive findings. You may monitor your results on your MyChart online as well.   ° °

## 2018-10-06 NOTE — ED Triage Notes (Signed)
Patient presents to Urgent Care with complaints of needing COVID testing since exposed at work. Patient reports she has no symptoms.

## 2018-10-06 NOTE — ED Provider Notes (Signed)
[facility]    CSN: [id number] Arrival date & time: 10/06/18  1837      History   Chief Complaint Chief Complaint  Patient presents with  . covid test    HPI Karen Jensen is a 33 y.o. female.   Karen BostonQuenyawntta L Fraiser presents with requests for covid testing. 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 She has been feeling well. No cough, no congestion, no fevers, no headache, no sore throat, no loss of taste or smell, no fatigue. No complaints today. History  Of asthma, eczema.    ROS per HPI, negative if not otherwise mentioned.      Past Medical History:  Diagnosis Date  . Arthritis   . Asthma   . Eczema     Patient Active Problem List   Diagnosis Date Noted  . Migraine 01/24/2015  . Asthma, chronic 01/24/2015  . Carpal tunnel syndrome 01/24/2015    Past Surgical History:  Procedure Laterality Date  . TYMPANOSTOMY TUBE PLACEMENT Bilateral     OB History   No obstetric history on file.      Home Medications    Prior to Admission medications   Medication Sig Start Date End Date Taking? Authorizing Provider  acetaminophen-codeine (TYLENOL #3) 300-30 MG tablet Take 1 tablet by mouth every 6 (six) hours as needed for moderate pain. 10/21/17   Kellie ShropshireShrosbree, Emily J, PA-C  albuterol (PROVENTIL HFA;VENTOLIN HFA) 108 (90 Base) MCG/ACT inhaler Inhale 2 puffs into the lungs every 4 (four) hours as needed for wheezing or shortness of breath. 08/18/16   Kirichenko, Lemont Fillersatyana, PA-C  albuterol (PROVENTIL HFA;VENTOLIN HFA) 108 (90 Base) MCG/ACT inhaler Inhale 1-2 puffs into the lungs every 4 (four) hours as needed for wheezing or shortness of breath. 12/12/16   Porfirio OarJeffery, Chelle, PA  clobetasol cream (TEMOVATE) 0.05 % Apply 1 application topically 2 (two) times daily. 02/08/15   Porfirio OarJeffery, Chelle, PA  cyclobenzaprine (FLEXERIL) 10 MG tablet Take 1 tablet (10 mg total) by mouth at bedtime and may repeat dose  one time if needed. 01/29/17   Bethel BornGekas, Kelly Marie, PA-C  diclofenac (CATAFLAM) 50 MG tablet Take 1 tablet (50 mg total) by mouth 3 (three) times daily. One tablet TID with food prn pain. 06/28/16   Dorena BodoKennard, Lawrence, NP  fexofenadine (ALLEGRA) 180 MG tablet Take 1 tablet (180 mg total) by mouth daily. 05/21/14   Sherren MochaShaw, Eva N, MD  gabapentin (NEURONTIN) 300 MG capsule Take 2 capsules (600 mg total) by mouth 3 (three) times daily. 06/28/16   Dorena BodoKennard, Lawrence, NP  HYDROcodone-acetaminophen (NORCO/VICODIN) 5-325 MG tablet Take 2 tablets by mouth every 4 (four) hours as needed. 10/28/17   Wynetta FinesMessick, Peter C, MD  mometasone-formoterol (DULERA) 200-5 MCG/ACT AERO Inhale 2 puffs into the lungs 2 (two) times daily. 12/12/16   Porfirio OarJeffery, Chelle, PA  polyethylene glycol powder (GLYCOLAX/MIRALAX) powder Take 17 g by mouth 2 (two) times daily as needed. 02/08/15   Porfirio OarJeffery, Chelle, PA  predniSONE (STERAPRED UNI-PAK 21 TAB) 10 MG (21) TBPK tablet Take prednisone pack as written 07/29/18   Hall-Potvin, GrenadaBrittany, PA-C  triamcinolone cream (KENALOG) 0.1 % Apply 1 application topically 2 (two) times daily. 07/29/18   Hall-Potvin, [place], PA-C    Family History Family History  Problem Relation Age of Onset  . Healthy Mother     Social History Social History   Tobacco Use  . Smoking status: Current Every Day Smoker    Packs/day:  0.25    Types: Cigarettes  . Smokeless tobacco: Never Used  Substance Use Topics  . Alcohol use: No    Alcohol/week: 0.0 standard drinks  . Drug use: Yes    Frequency: 7.0 times per week    Types: Marijuana     Allergies   Patient has no known allergies.   Review of Systems Review of Systems   Physical Exam Triage Vital Signs ED Triage Vitals  Enc Vitals Group     BP 10/06/18 1853 (!) 131/94     Pulse Rate 10/06/18 1853 91     Resp 10/06/18 1853 14     Temp 10/06/18 1853 98.8 F (37.1 C)     Temp Source 10/06/18 1853 Oral     SpO2 10/06/18 1853 99 %     Weight --       Height --      Head Circumference --      Peak Flow --      Pain Score 10/06/18 1852 0     Pain Loc --      Pain Edu? --      Excl. in [place]? --    No data found.  Updated Vital Signs BP (!) 131/94 (BP Location: Right Arm)   Pulse 91   Temp 98.8 F (37.1 C) (Oral)   Resp 14   SpO2 99%    Physical Exam Constitutional:      General: She is not in acute distress.    Appearance: She is well-developed.  Cardiovascular:     Rate and Rhythm: Normal rate.  Pulmonary:     Effort: Pulmonary effort is normal.  Skin:    General: Skin is warm and dry.  Neurological:     Mental Status: She is alert and oriented to person, place, and time.      UC Treatments / Results  Labs (all labs ordered are listed, but only abnormal results are displayed) Labs Reviewed  NOVEL CORONAVIRUS, NAA (HOSP ORDER, SEND-OUT TO REF LAB; TAT 18-24 HRS)    EKG   Radiology No results found.  Procedures Procedures (including critical care time)  Medications Ordered in UC Medications - No data to display  Initial Impression / Assessment and Plan / UC Course  I have reviewed the triage vital signs and the nursing notes.  Pertinent labs & imaging results that were available during my care of the patient were reviewed by me and considered in my medical decision making (see chart for details).     covid testing pending. Precautions provided. Explained that still may be too early for positive screen, return for any new symptoms or isolate. Patient verbalized understanding and agreeable to plan.  Ambulatory out of clinic without difficulty.    Final Clinical Impressions(s) / UC Diagnoses   Final diagnoses:  Encounter for laboratory test     Discharge Instructions     Self isolate until results are back and negative.  Will notify you of any positive findings. You may monitor your results on your MyChart online as well.       ED Prescriptions    None     Controlled Substance Prescriptions  [place] Controlled Substance Registry consulted? Not Applicable   Zigmund Gottron, NP 10/06/18 [phone]

## 2018-10-07 LAB — NOVEL CORONAVIRUS, NAA (HOSP ORDER, SEND-OUT TO REF LAB; TAT 18-24 HRS): SARS-CoV-2, NAA: NOT DETECTED

## 2018-10-08 ENCOUNTER — Encounter (HOSPITAL_COMMUNITY): Payer: Self-pay

## 2018-11-05 ENCOUNTER — Other Ambulatory Visit: Payer: Self-pay

## 2018-11-05 ENCOUNTER — Encounter (HOSPITAL_COMMUNITY): Payer: Self-pay

## 2018-11-05 ENCOUNTER — Emergency Department (HOSPITAL_COMMUNITY)
Admission: EM | Admit: 2018-11-05 | Discharge: 2018-11-05 | Disposition: A | Discharge status: Home / Self Care | Payer: Self-pay | Attending: Emergency Medicine | Admitting: Emergency Medicine

## 2018-11-05 DIAGNOSIS — J4520 Mild intermittent asthma, uncomplicated: Secondary | ICD-10-CM | POA: Insufficient documentation

## 2018-11-05 DIAGNOSIS — J45909 Unspecified asthma, uncomplicated: Secondary | ICD-10-CM | POA: Insufficient documentation

## 2018-11-05 DIAGNOSIS — F17210 Nicotine dependence, cigarettes, uncomplicated: Secondary | ICD-10-CM | POA: Insufficient documentation

## 2018-11-05 DIAGNOSIS — F1210 Cannabis abuse, uncomplicated: Secondary | ICD-10-CM | POA: Insufficient documentation

## 2018-11-05 DIAGNOSIS — Z79899 Other long term (current) drug therapy: Secondary | ICD-10-CM | POA: Insufficient documentation

## 2018-11-05 DIAGNOSIS — G5601 Carpal tunnel syndrome, right upper limb: Secondary | ICD-10-CM | POA: Insufficient documentation

## 2018-11-05 MED ORDER — GABAPENTIN 300 MG PO CAPS
300.0000 mg | ORAL_CAPSULE | Freq: Once | ORAL | Status: AC
  Administered 2018-11-05: 300 mg via ORAL
  Filled 2018-11-05: qty 1

## 2018-11-05 MED ORDER — ALBUTEROL SULFATE HFA 108 (90 BASE) MCG/ACT IN AERS
2.0000 | INHALATION_SPRAY | Freq: Once | RESPIRATORY_TRACT | Status: AC
  Administered 2018-11-05: 2 via RESPIRATORY_TRACT
  Filled 2018-11-05: qty 6.7

## 2018-11-05 MED ORDER — ALBUTEROL SULFATE HFA 108 (90 BASE) MCG/ACT IN AERS: 1.0000 | INHALATION_SPRAY | Freq: Four times a day (QID) | RESPIRATORY_TRACT | 0 refills | Status: DC | PRN

## 2018-11-05 MED ORDER — HYDROXYZINE HCL 25 MG PO TABS: 25.0000 mg | ORAL_TABLET | Freq: Every evening | ORAL | 0 refills | Status: DC | PRN

## 2018-11-05 MED ORDER — GABAPENTIN 300 MG PO CAPS: 300.0000 mg | ORAL_CAPSULE | Freq: Two times a day (BID) | ORAL | 0 refills | Status: DC

## 2018-11-05 NOTE — ED Notes (Signed)
Pt was verbalized discharge instructions. Pt had no further questions at this time. NAD. 

## 2018-11-05 NOTE — ED Provider Notes (Signed)
[facility]-EMERGENCY DEPT Provider Note   CSN: [id number] Arrival date & time: 11/05/18  0355     History   Chief Complaint Chief Complaint  Patient presents with  . needs inhaler  . Carpal Tunnel    HPI Karen Jensen is a 33 y.o. female.     The history is provided by the patient and medical records.     33 year old female presenting to the ED with multiple complaints.  1.  Shortness of breath--states history of asthma and has had some intermittent shortness of breath, worse at night, over the past 2 weeks.  States this usually happens with change in season.  She is supposed to be taking daily Qvar, however financially she cannot afford this so has been relying solely on her albuterol inhaler.  She ran out of this about a week ago and has not had any relief.  She denies any current shortness of breath, cough, fever, or Covid symptoms.  She is requesting new inhaler.  2.  Right wrist pain and finger tingling--reports history of carpal tunnel, particularly in the right wrist.  She does repetitive motions during the day.  She is right-hand dominant.  States she has chronic numbness and tingling in her right ring and middle fingers which is unchanged.  She is been wearing wrist brace that helps somewhat.  Lately she has been unable to sleep due to pain.  She reports in the past she is taking gabapentin which provided her with good relief, however has not had this in 6 weeks or more.  She denies any adverse side effects from medications.  3.  Requesting medications for sleep-- wakes up often at night due to #1 and #2. States she has difficulty falling asleep and if she wakes up in the middle night she has difficulty falling back asleep.  She often feels restless.  Past Medical History:  Diagnosis Date  . Arthritis   . Asthma   . Eczema     Patient Active Problem List   Diagnosis Date Noted  . Migraine 01/24/2015  . Asthma, chronic 01/24/2015  .  Carpal tunnel syndrome 01/24/2015    Past Surgical History:  Procedure Laterality Date  . TYMPANOSTOMY TUBE PLACEMENT Bilateral      OB History   No obstetric history on file.      Home Medications    Prior to Admission medications   Medication Sig Start Date End Date Taking? Authorizing Provider  acetaminophen-codeine (TYLENOL #3) 300-30 MG tablet Take 1 tablet by mouth every 6 (six) hours as needed for moderate pain. 10/21/17   Kellie Shropshire, PA-C  albuterol (PROVENTIL HFA;VENTOLIN HFA) 108 (90 Base) MCG/ACT inhaler Inhale 2 puffs into the lungs every 4 (four) hours as needed for wheezing or shortness of breath. 08/18/16   Kirichenko, Lemont Fillers, PA-C  albuterol (PROVENTIL HFA;VENTOLIN HFA) 108 (90 Base) MCG/ACT inhaler Inhale 1-2 puffs into the lungs every 4 (four) hours as needed for wheezing or shortness of breath. 12/12/16   Porfirio Oar, PA  clobetasol cream (TEMOVATE) 0.05 % Apply 1 application topically 2 (two) times daily. 02/08/15   Porfirio Oar, PA  cyclobenzaprine (FLEXERIL) 10 MG tablet Take 1 tablet (10 mg total) by mouth at bedtime and may repeat dose one time if needed. 01/29/17   Bethel Born, PA-C  diclofenac (CATAFLAM) 50 MG tablet Take 1 tablet (50 mg total) by mouth 3 (three) times daily. One tablet TID with food prn pain. 06/28/16   Dorena Bodo,  NP  fexofenadine (ALLEGRA) 180 MG tablet Take 1 tablet (180 mg total) by mouth daily. 05/21/14   Sherren MochaShaw, Eva N, MD  gabapentin (NEURONTIN) 300 MG capsule Take 2 capsules (600 mg total) by mouth 3 (three) times daily. 06/28/16   Dorena BodoKennard, Lawrence, NP  HYDROcodone-acetaminophen (NORCO/VICODIN) 5-325 MG tablet Take 2 tablets by mouth every 4 (four) hours as needed. 10/28/17   Wynetta FinesMessick, Peter C, MD  mometasone-formoterol (DULERA) 200-5 MCG/ACT AERO Inhale 2 puffs into the lungs 2 (two) times daily. 12/12/16   Porfirio OarJeffery, Chelle, PA  polyethylene glycol powder (GLYCOLAX/MIRALAX) powder Take 17 g by mouth 2 (two) times daily as  needed. 02/08/15   Porfirio OarJeffery, Chelle, PA  predniSONE (STERAPRED UNI-PAK 21 TAB) 10 MG (21) TBPK tablet Take prednisone pack as written 07/29/18   Hall-Potvin, [place], PA-C  triamcinolone cream (KENALOG) 0.1 % Apply 1 application topically 2 (two) times daily. 07/29/18   Hall-Potvin, [place], PA-C    Family History Family History  Problem Relation Age of Onset  . Healthy Mother     Social History Social History   Tobacco Use  . Smoking status: Current Every Day Smoker    Packs/day: 0.25    Types: Cigarettes  . Smokeless tobacco: Never Used  Substance Use Topics  . Alcohol use: No    Alcohol/week: 0.0 standard drinks  . Drug use: Yes    Frequency: 7.0 times per week    Types: Marijuana     Allergies   Patient has no known allergies.   Review of Systems Review of Systems  Respiratory: Positive for shortness of breath.   Musculoskeletal: Positive for arthralgias.  All other systems reviewed and are negative.    Physical Exam Updated Vital Signs BP (!) 145/104 (BP Location: Left Arm)   Pulse 88   Temp 98.6 F (37 C) (Oral)   Resp 18   Ht 5' (1.524 m)   Wt 99.8 kg   LMP 10/24/2018   SpO2 100%   BMI 42.97 kg/m   Physical Exam Vitals signs and nursing note reviewed.  Constitutional:      Appearance: She is well-developed.  HENT:     Head: Normocephalic and atraumatic.  Eyes:     Conjunctiva/sclera: Conjunctivae normal.     Pupils: Pupils are equal, round, and reactive to light.  Neck:     Musculoskeletal: Normal range of motion.  Cardiovascular:     Rate and Rhythm: Normal rate and regular rhythm.     Heart sounds: Normal heart sounds.  Pulmonary:     Effort: Pulmonary effort is normal.     Breath sounds: Normal breath sounds. No decreased breath sounds, wheezing or rhonchi.     Comments: Lungs clear, no distress, speaking in full sentences without difficulty Abdominal:     General: Bowel sounds are normal.     Palpations: Abdomen is soft.   Musculoskeletal: Normal range of motion.     Comments: Right wrist grossly normal in appearance, full range of motion maintained, no bony deformity or swelling, chronic decreased sensation of the distal third and fourth digits, positive Tinel's sign, normal radial pulse and cap refill  Skin:    General: Skin is warm and dry.  Neurological:     Mental Status: She is alert and oriented to person, place, and time.      ED Treatments / Results  Labs (all labs ordered are listed, but only abnormal results are displayed) Labs Reviewed - No data to display  EKG None  Radiology No  results found.  Procedures Procedures (including critical care time)  Medications Ordered in ED Medications  gabapentin (NEURONTIN) capsule 300 mg (300 mg Oral Given 11/05/18 0413)  albuterol (VENTOLIN HFA) 108 (90 Base) MCG/ACT inhaler 2 puff (2 puffs Inhalation Given 11/05/18 0415)     Initial Impression / Assessment and Plan / ED Course  I have reviewed the triage vital signs and the nursing notes.  Pertinent labs & imaging results that were available during my care of the patient were reviewed by me and considered in my medical decision making (see chart for details).  33 year old female presenting here with multiple complaints..  1.  Asthma--has issues this time year with change in season.  Has run out of her albuterol inhaler.  Denies any current shortness of breath, cough, fever.  She is afebrile nontoxic.  Lungs are clear, no distress.  Requesting refill of inhaler which was given here as well as prescription for same.  2.  Right wrist pain--history of carpal tunnel, used to be on gabapentin which helped her a great deal.  Has not taken this in 6 weeks or more.  Right wrist normal in appearance without visible swelling or deformity.  She does have positive Tinel's sign.  Decreased sensation of the distal tips of right third and fourth digits which is chronic.  She has good cap refill, hand is warm  and well-perfused.  Has wrist brace at home that she can wear, will restart gabapentin.  This likely will need to be tapered up further by her primary care doctor.  Also given referral to hand surgery for follow-up.  3.  Sleep issues--related to #1 and #2.  Has difficulty falling asleep and wakes up in the middle of the night and cannot go back to sleep.  Requesting medication to assist.  Given prescription for hydroxyzine to use as needed.  Recommended close follow-up with PCP for aforementioned issues.  She may return here for any new/acute changes.  Final Clinical Impressions(s) / ED Diagnoses   Final diagnoses:  Mild intermittent asthma without complication  Carpal tunnel syndrome of right wrist    ED Discharge Orders         Ordered    albuterol (VENTOLIN HFA) 108 (90 Base) MCG/ACT inhaler  Every 6 hours PRN     11/05/18 0422    gabapentin (NEURONTIN) 300 MG capsule  2 times daily     11/05/18 0422    hydrOXYzine (ATARAX/VISTARIL) 25 MG tablet  At bedtime PRN     11/05/18 0422           Larene Pickett, PA-C 11/05/18 [place]    Molpus, Jenny Reichmann, MD 11/05/18 [phone]

## 2018-11-05 NOTE — Discharge Instructions (Addendum)
Take the prescribed medication as directed.  Your primary care doctor may want to taper up your gabapentin. Can follow-up with hand surgery about your wrist-- can call office for appt. Return to the ED for new or worsening symptoms.

## 2018-11-05 NOTE — ED Triage Notes (Signed)
Pt coming from home c/o shortness of breath due to asthma x2 months. Just needs inhaler for that. Pt also requesting new medication (gabapentin) for carpal tunnel. No acute distress.

## 2018-12-28 ENCOUNTER — Ambulatory Visit (HOSPITAL_COMMUNITY)
Admission: EM | Admit: 2018-12-28 | Discharge: 2018-12-28 | Disposition: A | Discharge status: Home / Self Care | Payer: HRSA Program | Attending: Emergency Medicine | Admitting: Emergency Medicine

## 2018-12-28 ENCOUNTER — Other Ambulatory Visit: Payer: Self-pay

## 2018-12-28 ENCOUNTER — Encounter (HOSPITAL_COMMUNITY): Payer: Self-pay

## 2018-12-28 DIAGNOSIS — J4520 Mild intermittent asthma, uncomplicated: Secondary | ICD-10-CM | POA: Insufficient documentation

## 2018-12-28 DIAGNOSIS — Z20822 Contact with and (suspected) exposure to covid-19: Secondary | ICD-10-CM

## 2018-12-28 DIAGNOSIS — Z20828 Contact with and (suspected) exposure to other viral communicable diseases: Secondary | ICD-10-CM | POA: Diagnosis not present

## 2018-12-28 MED ORDER — ALBUTEROL SULFATE HFA 108 (90 BASE) MCG/ACT IN AERS: 1.0000 | INHALATION_SPRAY | Freq: Four times a day (QID) | RESPIRATORY_TRACT | 0 refills | Status: DC | PRN

## 2018-12-28 MED ORDER — MONTELUKAST SODIUM 10 MG PO TABS: 10.0000 mg | ORAL_TABLET | Freq: Every day | ORAL | 0 refills | Status: DC

## 2018-12-28 MED ORDER — AEROCHAMBER PLUS MISC: 2 refills | Status: DC

## 2018-12-28 NOTE — Discharge Instructions (Addendum)
2 puffs from your albuterol inhaler every 4 hours for 2 days, then every 6 hours for 2 days, then as needed.  Make sure you use a spacer with the inhaler.  Start the Singulair.  Talk to your primary care physician other options about inhaled steroids/long-acting bronchodilators.

## 2018-12-28 NOTE — ED Provider Notes (Signed)
HPI  SUBJECTIVE:  Karen Jensen is a 33 y.o. female who presents for COVID testing. Pt says that she had brief contact with a COVID postive coworker 2-3 days ago.  She states that she needs a Covid test for work.  No fevers, body aches, headaches, nasal congestion, sore throat, loss of sense of smell or taste, cough, shortness of breath, nausea, vomiting, diarrhea, abdominal pain.  She has a past medical history of asthma.  Uses an albuterol inhaler as needed, does not have a spacer.  States that she is supposed to be on Qvar, but cannot afford it.  She is a smoker.  No history of coronary disease, diabetes, HIV, cancer, immunocompromise, chronic kidney disease.  LMP: Now.  Denies possibility being pregnant.  PMD: Harrison Mons, PA   Past Medical History:  Diagnosis Date  . Arthritis   . Asthma   . Eczema     Past Surgical History:  Procedure Laterality Date  . TYMPANOSTOMY TUBE PLACEMENT Bilateral     Family History  Problem Relation Age of Onset  . Healthy Mother     Social History   Tobacco Use  . Smoking status: Current Every Day Smoker    Packs/day: 0.25    Types: Cigarettes  . Smokeless tobacco: Never Used  Substance Use Topics  . Alcohol use: No    Alcohol/week: 0.0 standard drinks  . Drug use: Yes    Frequency: 7.0 times per week    Types: Marijuana    No current facility-administered medications for this encounter.   Current Outpatient Medications:  .  albuterol (VENTOLIN HFA) 108 (90 Base) MCG/ACT inhaler, Inhale 1-2 puffs into the lungs every 6 (six) hours as needed for wheezing or shortness of breath., Disp: 18 g, Rfl: 0 .  clobetasol cream (TEMOVATE) 6.57 %, Apply 1 application topically 2 (two) times daily., Disp: 30 g, Rfl: 1 .  fexofenadine (ALLEGRA) 180 MG tablet, Take 1 tablet (180 mg total) by mouth daily., Disp: 90 tablet, Rfl: 3 .  gabapentin (NEURONTIN) 300 MG capsule, Take 1 capsule (300 mg total) by mouth 2 (two) times daily., Disp: 60  capsule, Rfl: 0 .  hydrOXYzine (ATARAX/VISTARIL) 25 MG tablet, Take 1 tablet (25 mg total) by mouth at bedtime as needed (sleep)., Disp: 12 tablet, Rfl: 0 .  mometasone-formoterol (DULERA) 200-5 MCG/ACT AERO, Inhale 2 puffs into the lungs 2 (two) times daily., Disp: 13 g, Rfl: 5 .  montelukast (SINGULAIR) 10 MG tablet, Take 1 tablet (10 mg total) by mouth at bedtime., Disp: 30 tablet, Rfl: 0 .  polyethylene glycol powder (GLYCOLAX/MIRALAX) powder, Take 17 g by mouth 2 (two) times daily as needed., Disp: 3350 g, Rfl: 1 .  Spacer/Aero-Holding Chambers (AEROCHAMBER PLUS) inhaler, Use as instructed, Disp: 1 each, Rfl: 2 .  triamcinolone cream (KENALOG) 0.1 %, Apply 1 application topically 2 (two) times daily., Disp: 30 g, Rfl: 1  No Known Allergies   ROS  As noted in HPI.   Physical Exam  BP 125/84 (BP Location: Left Arm)   Pulse 95   Temp 98.8 F (37.1 C) (Oral)   Resp 18   SpO2 100%   Constitutional: Well developed, well nourished, no acute distress Eyes:  EOMI, conjunctiva normal bilaterally HENT: Normocephalic, atraumatic,mucus membranes moist Respiratory: Normal inspiratory effort, rhonchi left side, diffuse wheezing throughout. Cardiovascular: Normal rate and rhythm no murmurs rubs or gallops GI: nondistended skin: No rash, skin intact Musculoskeletal: no deformities Neurologic: Alert & oriented x 3, no focal neuro  deficits Psychiatric: Speech and behavior appropriate   ED Course   Medications - No data to display  Orders Placed This Encounter  Procedures  . Novel Coronavirus, NAA (Hosp order, Send-out to Ref Lab; TAT 18-24 hrs    Standing Status:   Standing    Number of Occurrences:   1    Order Specific Question:   Is this test for diagnosis or screening    Answer:   Screening    Order Specific Question:   Symptomatic for COVID-19 as defined by CDC    Answer:   No    Order Specific Question:   Hospitalized for COVID-19    Answer:   No    Order Specific  Question:   Admitted to ICU for COVID-19    Answer:   No    Order Specific Question:   Previously tested for COVID-19    Answer:   Yes    Order Specific Question:   Resident in a congregate (group) care setting    Answer:   No    Order Specific Question:   Employed in healthcare setting    Answer:   No    Order Specific Question:   Pregnant    Answer:   No    No results found for this or any previous visit (from the past 24 hour(s)). No results found.  ED Clinical Impression  1. Exposure to COVID-19 virus   2. Mild intermittent asthma, unspecified whether complicated      ED Assessment/Plan  She has rhonchi and wheezing, but she has no complaints regarding her asthma.  Denies cough, wheezing, chest pain, shortness of breath, dyspnea on exertion.  Do not think she needs oral steroids.  Will try regular bronchodilators.  Albuterol 2 puffs every 4 hours for 2 days, then every 6 hours for 2 days, then as needed.  Will send home with an albuterol inhaler and spacer.  Will try starting her on Singulair since she cannot afford  the inhaled steroids/LABAs.  Follow-up with PMD as needed.  Covid PCR sent.  Discussed  MDM, treatment plan, and plan for follow-up with patient.  patient agrees with plan.   Meds ordered this encounter  Medications  . Spacer/Aero-Holding Chambers (AEROCHAMBER PLUS) inhaler    Sig: Use as instructed    Dispense:  1 each    Refill:  2  . montelukast (SINGULAIR) 10 MG tablet    Sig: Take 1 tablet (10 mg total) by mouth at bedtime.    Dispense:  30 tablet    Refill:  0  . albuterol (VENTOLIN HFA) 108 (90 Base) MCG/ACT inhaler    Sig: Inhale 1-2 puffs into the lungs every 6 (six) hours as needed for wheezing or shortness of breath.    Dispense:  18 g    Refill:  0    *This clinic note was created using Scientist, clinical (histocompatibility and immunogenetics). Therefore, there may be occasional mistakes despite careful proofreading.   ?    Domenick Gong, MD 12/28/18 [phone]

## 2018-12-28 NOTE — ED Triage Notes (Signed)
Pt presents for covid testing after possible exposure at work.

## 2018-12-30 LAB — NOVEL CORONAVIRUS, NAA (HOSP ORDER, SEND-OUT TO REF LAB; TAT 18-24 HRS): SARS-CoV-2, NAA: NOT DETECTED

## 2019-04-05 ENCOUNTER — Ambulatory Visit
Admission: EM | Admit: 2019-04-05 | Discharge: 2019-04-05 | Disposition: A | Discharge status: Home / Self Care | Payer: BC Managed Care – PPO | Attending: Physician Assistant | Admitting: Physician Assistant

## 2019-04-05 DIAGNOSIS — G5603 Carpal tunnel syndrome, bilateral upper limbs: Secondary | ICD-10-CM | POA: Diagnosis not present

## 2019-04-05 MED ORDER — DEXAMETHASONE SODIUM PHOSPHATE 10 MG/ML IJ SOLN
10.0000 mg | Freq: Once | INTRAMUSCULAR | Status: AC
  Administered 2019-04-05: 10 mg via INTRAMUSCULAR

## 2019-04-05 MED ORDER — DICLOFENAC SODIUM 50 MG PO TBEC: 50.0000 mg | DELAYED_RELEASE_TABLET | Freq: Two times a day (BID) | ORAL | 0 refills | Status: DC

## 2019-04-05 NOTE — Discharge Instructions (Signed)
Decadron injection in office today.  Start diclofenac as directed.  You can take the first dose when you get the medicine, and take the second dose 30 minutes before work.  After that, take a roughly about 12 hours apart, with the second dose 30 minutes before work.  Prior to work, ice compress to the wrists for about 20 minutes, and the same after work.  Wear your splint while you are sleeping. You can discontinue the diclofenac after symptoms improve, and can restart whenever needed.  Follow-up with PCP as scheduled for further evaluation and management needed.

## 2019-04-05 NOTE — ED Triage Notes (Signed)
Pt c/o bilateral hand pain and swelling. States hx of carpel tunnel for years and the pain has been worse since she started her new job 3wks ago. States can't get into her PCP for another 3wks.

## 2019-04-05 NOTE — ED Provider Notes (Signed)
[facility]    CSN: [id number] Arrival date & time: 04/05/19  0932      History   Chief Complaint Chief Complaint  Patient presents with  . Hand Pain    HPI Karen Jensen is a 34 y.o. female.   34 year old female comes in for bilateral hand/wrist pain and swelling. She has history of bilateral carpel tunnel, and has had intermittent pain with constant numbness to the 1st-3rd digit of right hand. She started new job 3 weeks ago that requires repetitive motion and has noticed symptoms worsening since then. She had significant worsening of symptoms last night and woke up with her hand burning/tingling and feeling swollen. This improved after a few hours. Denies injury/trauma.   Patient had been given gabapentin at the ED 11/05/2018 that she finished as directed, she did not feel that it provided any relief to her symptoms.       Past Medical History:  Diagnosis Date  . Arthritis   . Asthma   . Eczema     Patient Active Problem List   Diagnosis Date Noted  . Migraine 01/24/2015  . Asthma, chronic 01/24/2015  . Carpal tunnel syndrome 01/24/2015    Past Surgical History:  Procedure Laterality Date  . TYMPANOSTOMY TUBE PLACEMENT Bilateral     OB History   No obstetric history on file.      Home Medications    Prior to Admission medications   Medication Sig Start Date End Date Taking? Authorizing Provider  fexofenadine (ALLEGRA) 180 MG tablet Take 1 tablet (180 mg total) by mouth daily. 05/21/14  Yes Sherren Mocha, MD  Spacer/Aero-Holding Chambers (AEROCHAMBER PLUS) inhaler Use as instructed 12/28/18  Yes Domenick Gong, MD  gabapentin (NEURONTIN) 300 MG capsule Take 1 capsule (300 mg total) by mouth 2 (two) times daily. 11/05/18 04/05/19 Yes Garlon Hatchet, PA-C  albuterol (VENTOLIN HFA) 108 (90 Base) MCG/ACT inhaler Inhale 1-2 puffs into the lungs every 6 (six) hours as needed for wheezing or shortness of breath. 12/28/18   Domenick Gong, MD   diclofenac (VOLTAREN) 50 MG EC tablet Take 1 tablet (50 mg total) by mouth 2 (two) times daily. 04/05/19 05/05/19  Belinda Fisher, PA-C  mometasone-formoterol (DULERA) 200-5 MCG/ACT AERO Inhale 2 puffs into the lungs 2 (two) times daily. 12/12/16   Porfirio Oar, PA    Family History Family History  Problem Relation Age of Onset  . Healthy Mother     Social History Social History   Tobacco Use  . Smoking status: Current Every Day Smoker    Packs/day: 0.25    Types: Cigarettes  . Smokeless tobacco: Never Used  Substance Use Topics  . Alcohol use: No    Alcohol/week: 0.0 standard drinks  . Drug use: Yes    Frequency: 7.0 times per week    Types: Marijuana     Allergies   Patient has no known allergies.   Review of Systems Review of Systems  Reason unable to perform ROS: See HPI as above.     Physical Exam Triage Vital Signs ED Triage Vitals [04/05/19 0834]  Enc Vitals Group     BP (!) 139/102     Pulse Rate 92     Resp 16     Temp 98.3 F (36.8 C)     Temp Source Oral     SpO2 98 %     Weight      Height      Head Circumference  Peak Flow      Pain Score 8     Pain Loc      Pain Edu?      Excl. in [place]?    No data found.  Updated Vital Signs BP (!) 139/102 (BP Location: Left Arm)   Pulse 92   Temp 98.3 F (36.8 C) (Oral)   Resp 16   LMP 03/01/2019   SpO2 98%   Physical Exam Constitutional:      General: She is not in acute distress.    Appearance: Normal appearance. She is well-developed. She is not toxic-appearing or diaphoretic.  HENT:     Head: Normocephalic and atraumatic.  Eyes:     Conjunctiva/sclera: Conjunctivae normal.     Pupils: Pupils are equal, round, and reactive to light.  Pulmonary:     Effort: Pulmonary effort is normal. No respiratory distress.     Comments: Speaking in full sentences without difficulty Musculoskeletal:     Cervical back: Normal range of motion and neck supple.     Comments: No swelling, erythema, warmth,  contusion.  No tenderness to palpation of the elbow, forearm bilaterally.  Tenderness to palpation left medial flexor wrist bilaterally.  No tenderness to palpation of the hand.  Full ROM of elbow, wrist, fingers.  Strength 5/5 BUE. Sensation 3/5 of 1st-3rd digit of right hand, otherwise intact. Radial pulse 2+, cap refill <2s.  Positive Tinel's and Phalen's.  Skin:    General: Skin is warm and dry.  Neurological:     Mental Status: She is alert and oriented to person, place, and time.      UC Treatments / Results  Labs (all labs ordered are listed, but only abnormal results are displayed) Labs Reviewed - No data to display  EKG   Radiology No results found.  Procedures Procedures (including critical care time)  Medications Ordered in UC Medications  dexamethasone (DECADRON) injection 10 mg (10 mg Intramuscular Given 04/05/19 0917)    Initial Impression / Assessment and Plan / UC Course  I have reviewed the triage vital signs and the nursing notes.  Pertinent labs & imaging results that were available during my care of the patient were reviewed by me and considered in my medical decision making (see chart for details).    Decadron injection in office today.  Will have patient take diclofenac as directed for further symptomatic relief.  Patient is unable to wear wrist splint while working, will encourage wearing at night to relieve symptoms.  Ice compress.  Patient to follow-up as scheduled with PCP for further evaluation and management needed.  Discussed may need evaluation by sports medicine/orthopedic if symptoms do not improving.  Return precautions given.  Patient expresses understanding and agrees to plan.  Final Clinical Impressions(s) / UC Diagnoses   Final diagnoses:  Bilateral carpal tunnel syndrome   ED Prescriptions    Medication Sig Dispense Auth. Provider   diclofenac (VOLTAREN) 50 MG EC tablet Take 1 tablet (50 mg total) by mouth 2 (two) times daily. 60  tablet Ok Edwards, PA-C     PDMP not reviewed this encounter.   Ok Edwards, PA-C 04/05/19 [phone]

## 2019-04-18 ENCOUNTER — Encounter: Payer: Self-pay | Admitting: Internal Medicine

## 2019-04-27 ENCOUNTER — Encounter: Payer: Self-pay | Admitting: Internal Medicine

## 2019-04-27 ENCOUNTER — Other Ambulatory Visit: Payer: Self-pay | Admitting: Internal Medicine

## 2019-04-27 ENCOUNTER — Ambulatory Visit (INDEPENDENT_AMBULATORY_CARE_PROVIDER_SITE_OTHER): Payer: BC Managed Care – PPO | Admitting: Internal Medicine

## 2019-04-27 VITALS — BP 141/91 | HR 92 | Temp 97.3°F | Resp 17 | Ht 61.0 in | Wt 223.0 lb

## 2019-04-27 DIAGNOSIS — Z7689 Persons encountering health services in other specified circumstances: Secondary | ICD-10-CM | POA: Diagnosis not present

## 2019-04-27 DIAGNOSIS — G5603 Carpal tunnel syndrome, bilateral upper limbs: Secondary | ICD-10-CM

## 2019-04-27 DIAGNOSIS — J45909 Unspecified asthma, uncomplicated: Secondary | ICD-10-CM

## 2019-04-27 DIAGNOSIS — F17200 Nicotine dependence, unspecified, uncomplicated: Secondary | ICD-10-CM | POA: Insufficient documentation

## 2019-04-27 DIAGNOSIS — L309 Dermatitis, unspecified: Secondary | ICD-10-CM

## 2019-04-27 MED ORDER — MOMETASONE FURO-FORMOTEROL FUM 200-5 MCG/ACT IN AERO: 2.0000 | INHALATION_SPRAY | Freq: Two times a day (BID) | RESPIRATORY_TRACT | 5 refills | Status: DC

## 2019-04-27 MED ORDER — TRIAMCINOLONE ACETONIDE 0.1 % EX CREA: 1.0000 "application " | TOPICAL_CREAM | Freq: Two times a day (BID) | CUTANEOUS | 0 refills | Status: DC

## 2019-04-27 MED ORDER — MELOXICAM 15 MG PO TABS: 15.0000 mg | ORAL_TABLET | Freq: Every day | ORAL | 0 refills | Status: DC

## 2019-04-27 MED ORDER — ALBUTEROL SULFATE (2.5 MG/3ML) 0.083% IN NEBU: 2.5000 mg | INHALATION_SOLUTION | Freq: Four times a day (QID) | RESPIRATORY_TRACT | 1 refills | Status: DC | PRN

## 2019-04-27 NOTE — Progress Notes (Signed)
Subjective:    Karen Jensen - 34 y.o. female MRN [id number]  Date of birth: October 21, 1985  HPI  Karen Jensen is to establish care. Patient has a PMH significant for asthma, anxiety, tobacco use.   She was seen at urgent care on 3/16 for bilateral carpal tunnel syndrome. She has had carpal tunnel for 5-6 years. A decadron injection was given at the urgent care. She was prescribed Diclofenac. Reports medication is not helping. She is wearing bilateral cock up wrist splints nightly and when she can during the day. She has not seen a specialist for it, reports she does not want the surgery. She does feel like it is getting worse to the point where she can't open her hands.   Asthma: She has been out of her [facility]. She does have Albuterol. Does feel like her breathing has worsened a little bit with the weather change.   ROS per HPI   Health Maintenance:  Health Maintenance Due  Topic Date Due  . PAP SMEAR-Modifier  06/17/2015     Past Medical History: Patient Active Problem List   Diagnosis Date Noted  . Migraine 01/24/2015  . Asthma, chronic 01/24/2015  . Carpal tunnel syndrome 01/24/2015      Social History   reports that she has been smoking cigarettes. She has been smoking about 0.25 packs per day. She has never used smokeless tobacco. She reports current drug use. Frequency: 7.00 times per week. Drug: Marijuana. She reports that she does not drink alcohol.   Family History  family history includes Eczema in her sister; Healthy in her mother; Lupus in her maternal grandmother.   Medications: reviewed and updated   Objective:   Physical Exam Temp (!) 97.3 F (36.3 C) (Temporal)   Resp 17   Wt 223 lb (101.2 kg)   BMI 43.55 kg/m  Physical Exam  Constitutional: She is oriented to person, place, and time and well-developed, well-nourished, and in no distress. No distress.  HENT:  Head: Normocephalic and atraumatic.  Eyes: Conjunctivae and EOM are normal.   Cardiovascular: Normal rate, regular rhythm and normal heart sounds.  No murmur heard. Pulmonary/Chest: Effort normal and breath sounds normal. No respiratory distress.  Musculoskeletal:        General: Normal range of motion.  Neurological: She is alert and oriented to person, place, and time.  Skin: Skin is warm and dry. She is not diaphoretic.  Psychiatric: Affect and judgment normal.        Assessment & Plan:   1. Encounter to establish care  2. Carpal tunnel syndrome, bilateral upper limbs Will switch anti-inflammatory medication and refer to specialist given long term symptoms without improvement with wrist splints.  - Ambulatory referral to Orthopedic Surgery - meloxicam (MOBIC) 15 MG tablet; Take 1 tablet (15 mg total) by mouth daily.  Dispense: 30 tablet; Refill: 0  3. Asthma, chronic, unspecified asthma severity, uncomplicated No sign of acute exacerbation. Will restart Dulera. Will get patient set up with nebulizer machine for home use especially because she reports she tends to lose her inhalers.   - mometasone-formoterol (DULERA) 200-5 MCG/ACT AERO; Inhale 2 puffs into the lungs 2 (two) times daily.  Dispense: 13 g; Refill: 5 - albuterol (PROVENTIL) (2.5 MG/3ML) 0.083% nebulizer solution; Take 3 mLs (2.5 mg total) by nebulization every 6 (six) hours as needed for wheezing or shortness of breath.  Dispense: 150 mL; Refill: 1  4. Eczema, unspecified type - triamcinolone cream (KENALOG) 0.1 %; Apply 1 application  topically 2 (two) times daily.  Dispense: 453.6 g; Refill: 0       Phill Myron, D.O. 04/27/2019, 10:00 AM Primary Care at [facility]

## 2019-05-05 ENCOUNTER — Ambulatory Visit: Payer: BC Managed Care – PPO | Admitting: Orthopaedic Surgery

## 2019-05-16 ENCOUNTER — Encounter: Payer: Self-pay | Admitting: Emergency Medicine

## 2019-05-16 ENCOUNTER — Ambulatory Visit
Admission: EM | Admit: 2019-05-16 | Discharge: 2019-05-16 | Disposition: A | Discharge status: Home / Self Care | Payer: BC Managed Care – PPO

## 2019-05-16 ENCOUNTER — Other Ambulatory Visit: Payer: Self-pay

## 2019-05-16 DIAGNOSIS — M25532 Pain in left wrist: Secondary | ICD-10-CM

## 2019-05-16 MED ORDER — PREDNISONE 50 MG PO TABS: 50.0000 mg | ORAL_TABLET | Freq: Every day | ORAL | 0 refills | Status: DC

## 2019-05-16 NOTE — ED Triage Notes (Signed)
Reports carpal runnel issues in left hand.  Pins and needles in palm of hand.  Patient can feel her left little finger, but other fingers are numb, no numbness in left thumb.  Patient is right handed

## 2019-05-16 NOTE — ED Provider Notes (Signed)
[facility]    CSN: [id number] Arrival date & time: 05/16/19  1134      History   Chief Complaint Chief Complaint  Patient presents with  . Hand Pain    HPI Karen Jensen is a 34 y.o. female.   34 year old female comes in for worsening left wrist pain from chronic carpal tunnel.  She was seen 04/05/2019 for same, and was given Decadron injection in office, diclofenac without relief.  States was provided right wrist splint, which seem to help her right carpal tunnel.  She saw PCP after last visit, and was switched to Mobic without relief.  She is referred to orthopedics.  Patient's work requires repetitive motion, and has continued to work since symptom onset.     Past Medical History:  Diagnosis Date  . Anxiety   . Arthritis   . BMI 45.0-49.9, adult (HCC)   . Chronic asthma   . Chronic pain of right ankle   . Chronic rhinitis   . Eczema   . Migraines   . Smoker   . Snoring     Patient Active Problem List   Diagnosis Date Noted  . Smoker 04/27/2019  . Anxiety 06/24/2018  . Chronic rhinitis 06/24/2018  . BMI 45.0-49.9, adult (HCC) 06/24/2018  . Migraine 01/24/2015  . Asthma, chronic 01/24/2015  . Carpal tunnel syndrome 01/24/2015    Past Surgical History:  Procedure Laterality Date  . TYMPANOSTOMY TUBE PLACEMENT Bilateral     OB History   No obstetric history on file.      Home Medications    Prior to Admission medications   Medication Sig Start Date End Date Taking? Authorizing Provider  Acetaminophen (TYLENOL ARTHRITIS PAIN PO) Take by mouth.   Yes [provider]  albuterol (PROVENTIL) (2.5 MG/3ML) 0.083% nebulizer solution Take 3 mLs (2.5 mg total) by nebulization every 6 (six) hours as needed for wheezing or shortness of breath. 04/27/19   Arvilla Market, DO  albuterol (VENTOLIN HFA) 108 (90 Base) MCG/ACT inhaler Inhale 1-2 puffs into the lungs every 6 (six) hours as needed for wheezing or shortness of breath.  12/28/18   Domenick Gong, MD  Virgel Bouquet ELLIPTA 200-25 MCG/INH AEPB Please specify directions, refills and quantity 04/28/19   Arvilla Market, DO  predniSONE (DELTASONE) 50 MG tablet Take 1 tablet (50 mg total) by mouth daily with breakfast. 05/16/19   Cathie Hoops, Kaimana Neuzil V, PA-C  triamcinolone cream (KENALOG) 0.1 % Apply 1 application topically 2 (two) times daily. 04/27/19   Arvilla Market, DO  gabapentin (NEURONTIN) 300 MG capsule Take 1 capsule (300 mg total) by mouth 2 (two) times daily. 11/05/18 04/05/19  Garlon Hatchet, PA-C    Family History Family History  Problem Relation Age of Onset  . Healthy Mother   . Eczema Sister   . Lupus Maternal Grandmother     Social History Social History   Tobacco Use  . Smoking status: Current Every Day Smoker    Packs/day: 0.25    Types: Cigarettes  . Smokeless tobacco: Never Used  Substance Use Topics  . Alcohol use: No    Alcohol/week: 0.0 standard drinks  . Drug use: Yes    Frequency: 7.0 times per week    Types: Marijuana     Allergies   Patient has no known allergies.   Review of Systems Review of Systems  Reason unable to perform ROS: See HPI as above.     Physical Exam Triage Vital Signs ED Triage  Vitals  Enc Vitals Group     BP 05/16/19 1350 (!) 136/91     Pulse Rate 05/16/19 1350 90     Resp 05/16/19 1350 18     Temp 05/16/19 1350 98.3 F (36.8 C)     Temp Source 05/16/19 1350 Oral     SpO2 05/16/19 1350 96 %     Weight --      Height --      Head Circumference --      Peak Flow --      Pain Score 05/16/19 1345 8     Pain Loc --      Pain Edu? --      Excl. in [place]? --    No data found.  Updated Vital Signs BP (!) 136/91 (BP Location: Right Arm) Comment (BP Location): large cuff  Pulse 90   Temp 98.3 F (36.8 C) (Oral)   Resp 18   SpO2 96%   Visual Acuity Right Eye Distance:   Left Eye Distance:   Bilateral Distance:    Right Eye Near:   Left Eye Near:    Bilateral Near:     Physical  Exam Constitutional:      General: She is not in acute distress.    Appearance: Normal appearance. She is well-developed. She is not toxic-appearing or diaphoretic.  HENT:     Head: Normocephalic and atraumatic.  Eyes:     Conjunctiva/sclera: Conjunctivae normal.     Pupils: Pupils are equal, round, and reactive to light.  Pulmonary:     Effort: Pulmonary effort is normal. No respiratory distress.     Comments: Speaking in full sentences without difficulty Musculoskeletal:     Cervical back: Normal range of motion and neck supple.     Comments: No swelling, erythema, warmth, contusion. No tenderness to palpation of wrist, fingers. Strength 5/5. Sensation 3-4/5 of 1st 3 digits. Radial pulse 2+, cap refill <2s  Skin:    General: Skin is warm and dry.  Neurological:     Mental Status: She is alert and oriented to person, place, and time.      UC Treatments / Results  Labs (all labs ordered are listed, but only abnormal results are displayed) Labs Reviewed - No data to display  EKG   Radiology No results found.  Procedures Procedures (including critical care time)  Medications Ordered in UC Medications - No data to display  Initial Impression / Assessment and Plan / UC Course  I have reviewed the triage vital signs and the nursing notes.  Pertinent labs & imaging results that were available during my care of the patient were reviewed by me and considered in my medical decision making (see chart for details).    Will do trial of prednisone.  Left wrist splint provided.  Patient to follow-up with orthopedics for further evaluation and management if symptoms not improving.  Return precautions given.  Patient expresses understanding and agrees to plan.  Final Clinical Impressions(s) / UC Diagnoses   Final diagnoses:  Left wrist pain    ED Prescriptions    Medication Sig Dispense Auth. Provider   predniSONE (DELTASONE) 50 MG tablet Take 1 tablet (50 mg total) by mouth  daily with breakfast. 5 tablet Ok Edwards, PA-C     PDMP not reviewed this encounter.   Ok Edwards, PA-C 05/16/19 1427

## 2019-05-16 NOTE — Discharge Instructions (Signed)
Start prednisone as directed. Left wrist splint. Ice compress, rest. Follow up with orthopedics if symptoms not improving.

## 2019-08-05 ENCOUNTER — Other Ambulatory Visit: Payer: Self-pay

## 2019-08-05 ENCOUNTER — Ambulatory Visit (HOSPITAL_COMMUNITY)
Admission: EM | Admit: 2019-08-05 | Discharge: 2019-08-05 | Disposition: A | Discharge status: Home / Self Care | Payer: Managed Care, Other (non HMO) | Attending: Family Medicine | Admitting: Family Medicine

## 2019-08-05 ENCOUNTER — Encounter (HOSPITAL_COMMUNITY): Payer: Self-pay

## 2019-08-05 DIAGNOSIS — J4521 Mild intermittent asthma with (acute) exacerbation: Secondary | ICD-10-CM

## 2019-08-05 DIAGNOSIS — J45909 Unspecified asthma, uncomplicated: Secondary | ICD-10-CM

## 2019-08-05 MED ORDER — BECLOMETHASONE DIPROPIONATE 80 MCG/ACT IN AERS: 1.0000 | INHALATION_SPRAY | Freq: Two times a day (BID) | RESPIRATORY_TRACT | 12 refills | Status: DC

## 2019-08-05 MED ORDER — ALBUTEROL SULFATE HFA 108 (90 BASE) MCG/ACT IN AERS: 1.0000 | INHALATION_SPRAY | Freq: Four times a day (QID) | RESPIRATORY_TRACT | 0 refills | Status: DC | PRN

## 2019-08-05 MED ORDER — GABAPENTIN 300 MG PO CAPS: 300.0000 mg | ORAL_CAPSULE | Freq: Every day | ORAL | 1 refills | Status: DC

## 2019-08-05 NOTE — ED Triage Notes (Signed)
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 Pt states experiences cough when she gets SOB at work. Pt states she is having to use her rescue inhaler more frequently and requests refill of "steroid inhaler".  Reports negative COVID test last week which was performed at her job (rapid test).   Pt denies fever, chills, productive, congestion, sore throat, abdom pain, n/v/d.  Breath sounds CTA bilaterally.

## 2019-08-05 NOTE — Discharge Instructions (Addendum)
Medicine as prescribed °Follow up as needed for continued or worsening symptoms ° °

## 2019-08-06 NOTE — ED Provider Notes (Signed)
[facility]    CSN: [id number] Arrival date & time: 08/05/19  1425      History   Chief Complaint Chief Complaint  Patient presents with  . Shortness of Breath    HPI Karen Jensen is a 34 y.o. female.   Pt is a 34 year old female with PMH of asthma. She is here for asthma flare, sos mainly at work. She is exposed to powder based materials at work that flare her asthma. Experiences cough with this ans wheezing. Has been using Claritin and her albuterol inhaler which helps. She is using her albuterol more often than usual. She is out of her steroid inhaler. Negative covid testing as she gets tested every 2 weeks at work. No fever, chills, congestion, sore throat, ear pain, N,V,D.  Also requesting refill on gabapentin. Has appointment with her PCP in September.   ROS per HPI      Past Medical History:  Diagnosis Date  . Anxiety   . Arthritis   . BMI 45.0-49.9, adult (HCC)   . Chronic asthma   . Chronic pain of right ankle   . Chronic rhinitis   . Eczema   . Migraines   . Smoker   . Snoring     Patient Active Problem List   Diagnosis Date Noted  . Smoker 04/27/2019  . Anxiety 06/24/2018  . Chronic rhinitis 06/24/2018  . BMI 45.0-49.9, adult (HCC) 06/24/2018  . Migraine 01/24/2015  . Asthma, chronic 01/24/2015  . Carpal tunnel syndrome 01/24/2015    Past Surgical History:  Procedure Laterality Date  . TYMPANOSTOMY TUBE PLACEMENT Bilateral     OB History   No obstetric history on file.      Home Medications    Prior to Admission medications   Medication Sig Start Date End Date Taking? Authorizing Provider  Acetaminophen (TYLENOL ARTHRITIS PAIN PO) Take by mouth.    [provider]  albuterol (VENTOLIN HFA) 108 (90 Base) MCG/ACT inhaler Inhale 1-2 puffs into the lungs every 6 (six) hours as needed for wheezing or shortness of breath. 08/05/19   Dahlia Byes A, NP  beclomethasone (QVAR) 80 MCG/ACT inhaler Inhale 1 puff into the  lungs in the morning and at bedtime. 08/05/19   Janace Aris, NP  BREO ELLIPTA 200-25 MCG/INH AEPB Please specify directions, refills and quantity 04/28/19   Arvilla Market, DO  gabapentin (NEURONTIN) 300 MG capsule Take 1 capsule (300 mg total) by mouth daily. 08/05/19   Dahlia Byes A, NP  predniSONE (DELTASONE) 50 MG tablet Take 1 tablet (50 mg total) by mouth daily with breakfast. 05/16/19   Cathie Hoops, Amy V, PA-C  triamcinolone cream (KENALOG) 0.1 % Apply 1 application topically 2 (two) times daily. 04/27/19   Arvilla Market, DO    Family History Family History  Problem Relation Age of Onset  . Healthy Mother   . Eczema Sister   . Lupus Maternal Grandmother     Social History Social History   Tobacco Use  . Smoking status: Current Every Day Smoker    Packs/day: 0.25    Types: Cigarettes  . Smokeless tobacco: Never Used  Vaping Use  . Vaping Use: Never used  Substance Use Topics  . Alcohol use: No    Alcohol/week: 0.0 standard drinks  . Drug use: Yes    Frequency: 7.0 times per week    Types: Marijuana     Allergies   Other   Review of Systems Review of Systems  Physical Exam Triage Vital Signs ED Triage Vitals  Enc Vitals Group     BP 08/05/19 1535 (!) 124/92     Pulse Rate 08/05/19 1535 81     Resp 08/05/19 1535 18     Temp 08/05/19 1535 98.6 F (37 C)     Temp Source 08/05/19 1535 Oral     SpO2 08/05/19 1535 100 %     Weight --      Height --      Head Circumference --      Peak Flow --      Pain Score 08/05/19 1531 0     Pain Loc --      Pain Edu? --      Excl. in [place]? --    No data found.  Updated Vital Signs BP (!) 124/92 (BP Location: Right Arm)   Pulse 81   Temp 98.6 F (37 C) (Oral)   Resp 18   LMP 07/28/2019   SpO2 100%   Visual Acuity Right Eye Distance:   Left Eye Distance:   Bilateral Distance:    Right Eye Near:   Left Eye Near:    Bilateral Near:     Physical Exam Vitals and nursing note reviewed.    Constitutional:      General: She is not in acute distress.    Appearance: Normal appearance. She is not ill-appearing, toxic-appearing or diaphoretic.  HENT:     Head: Normocephalic.     Nose: Nose normal.     Mouth/Throat:     Pharynx: Oropharynx is clear.  Eyes:     Conjunctiva/sclera: Conjunctivae normal.  Cardiovascular:     Rate and Rhythm: Normal rate and regular rhythm.     Pulses: Normal pulses.     Heart sounds: Normal heart sounds.  Pulmonary:     Effort: Pulmonary effort is normal.     Breath sounds: Normal breath sounds.  Musculoskeletal:        General: Normal range of motion.     Cervical back: Normal range of motion.  Skin:    General: Skin is warm and dry.     Findings: No rash.  Neurological:     Mental Status: She is alert.  Psychiatric:        Mood and Affect: Mood normal.      UC Treatments / Results  Labs (all labs ordered are listed, but only abnormal results are displayed) Labs Reviewed - No data to display  EKG   Radiology No results found.  Procedures Procedures (including critical care time)  Medications Ordered in UC Medications - No data to display  Initial Impression / Assessment and Plan / UC Course  I have reviewed the triage vital signs and the nursing notes.  Pertinent labs & imaging results that were available during my care of the patient were reviewed by me and considered in my medical decision making (see chart for details).     Asthma exacerbation Refilled her steroid inhlaer, QVAR, rescue inhaler and gabapentin as requested Claritin daily.  Exam normal and vitals normal Lungs clear  Follow up as needed for continued or worsening symptoms  Final Clinical Impressions(s) / UC Diagnoses   Final diagnoses:  Mild intermittent asthma with exacerbation     Discharge Instructions     Medicine as prescribed Follow up as needed for continued or worsening symptoms     ED Prescriptions    Medication Sig  Dispense Auth. Provider   beclomethasone (QVAR) 80  MCG/ACT inhaler Inhale 1 puff into the lungs in the morning and at bedtime. 1 Inhaler Chandra Asher A, NP   albuterol (VENTOLIN HFA) 108 (90 Base) MCG/ACT inhaler Inhale 1-2 puffs into the lungs every 6 (six) hours as needed for wheezing or shortness of breath. 18 g Vernella Niznik A, NP   gabapentin (NEURONTIN) 300 MG capsule Take 1 capsule (300 mg total) by mouth daily. 30 capsule Tyaira Heward A, NP     PDMP not reviewed this encounter.   Janace Aris, NP 08/06/19 1623

## 2019-09-30 ENCOUNTER — Ambulatory Visit (INDEPENDENT_AMBULATORY_CARE_PROVIDER_SITE_OTHER): Payer: Managed Care, Other (non HMO) | Admitting: Registered Nurse

## 2019-09-30 ENCOUNTER — Other Ambulatory Visit: Payer: Self-pay

## 2019-09-30 ENCOUNTER — Encounter: Payer: Self-pay | Admitting: Registered Nurse

## 2019-09-30 VITALS — BP 127/84 | HR 82 | Temp 97.6°F | Resp 18 | Ht 61.0 in | Wt 216.8 lb

## 2019-09-30 DIAGNOSIS — L309 Dermatitis, unspecified: Secondary | ICD-10-CM

## 2019-09-30 DIAGNOSIS — Z1159 Encounter for screening for other viral diseases: Secondary | ICD-10-CM

## 2019-09-30 DIAGNOSIS — J45909 Unspecified asthma, uncomplicated: Secondary | ICD-10-CM

## 2019-09-30 DIAGNOSIS — Z840 Family history of diseases of the skin and subcutaneous tissue: Secondary | ICD-10-CM | POA: Diagnosis not present

## 2019-09-30 DIAGNOSIS — G5603 Carpal tunnel syndrome, bilateral upper limbs: Secondary | ICD-10-CM

## 2019-09-30 DIAGNOSIS — Z1329 Encounter for screening for other suspected endocrine disorder: Secondary | ICD-10-CM | POA: Diagnosis not present

## 2019-09-30 DIAGNOSIS — Z13220 Encounter for screening for lipoid disorders: Secondary | ICD-10-CM

## 2019-09-30 DIAGNOSIS — Z130 Encounter for screening for diseases of the blood and blood-forming organs and certain disorders involving the immune mechanism: Secondary | ICD-10-CM

## 2019-09-30 DIAGNOSIS — Z13228 Encounter for screening for other metabolic disorders: Secondary | ICD-10-CM

## 2019-09-30 MED ORDER — TRIAMCINOLONE ACETONIDE 0.1 % EX CREA: 1.0000 "application " | TOPICAL_CREAM | Freq: Two times a day (BID) | CUTANEOUS | 2 refills | Status: DC

## 2019-09-30 MED ORDER — ALBUTEROL SULFATE HFA 108 (90 BASE) MCG/ACT IN AERS: 1.0000 | INHALATION_SPRAY | Freq: Four times a day (QID) | RESPIRATORY_TRACT | 0 refills | Status: DC | PRN

## 2019-09-30 MED ORDER — ALBUTEROL SULFATE (2.5 MG/3ML) 0.083% IN NEBU: 2.5000 mg | INHALATION_SOLUTION | Freq: Four times a day (QID) | RESPIRATORY_TRACT | 1 refills | Status: DC | PRN

## 2019-09-30 MED ORDER — BECLOMETHASONE DIPROPIONATE 80 MCG/ACT IN AERS: 1.0000 | INHALATION_SPRAY | Freq: Two times a day (BID) | RESPIRATORY_TRACT | 3 refills | Status: DC

## 2019-09-30 NOTE — Patient Instructions (Signed)
° ° ° °  If you have lab work done today you will be contacted with your lab results within the next 2 weeks.  If you have not heard from us then please contact us. The fastest way to get your results is to register for My Chart. ° ° °IF you received an x-ray today, you will receive an invoice from [facility]. Please contact [facility] at [phone] with questions or concerns regarding your invoice.  ° °IF you received labwork today, you will receive an invoice from [facility]. Please contact [facility] at [phone] with questions or concerns regarding your invoice.  ° °Our billing staff will not be able to assist you with questions regarding bills from these companies. ° °You will be contacted with the lab results as soon as they are available. The fastest way to get your results is to activate your My Chart account. Instructions are located on the last page of this paperwork. If you have not heard from us regarding the results in 2 weeks, please contact this office. °  ° ° ° °

## 2019-09-30 NOTE — Progress Notes (Signed)
New Patient Office Visit  Subjective:  Patient ID: Karen Jensen, female    DOB: 1985-05-16  Age: 34 y.o. MRN: [id number]  CC:  Chief Complaint  Patient presents with  . New Patient (Initial Visit)    Patiet states she is a new patient. Patient would like to discuss an Referral and medications     HPI Karen Jensen presents for visit to est care. Has a few concerns:  Asthma: Having trouble with her QVar - thinks it may be broken. Often times medication will not be dispensed. This has led to her using her rescue inhaler more frequently. She is running low on albuterol. Notes that when she can use QVar her symptoms are well controlled and she doesn't have concerns. Occ gets exacerbations that in the past were relieved with nebulizer - no longer has one, requesting DME order today with albuterol neb solution.  Labs: Hoping to get routine labs done. Fam hx of lupus and heart disease - given some of her joint pains and increase in smoking, concern that an autoimmune do may have started.  Carpal Tunnel: bilateral. Ongoing 6-7 years. Has delayed surgical intervention or specialist care in the past. Now pain is waking her up nightly despite good compliance with wrist braces. Requesting referral.   Lactose Intolerance: chronic, ongoing. Has not been able to tolerate milk substitutes. Has success with elimination diet but wants to know if there are any good treatments for this.  Eczema: episodic, treated well with triamcinolone. Needs refill. No flexural involvement - does not seem to be psoriasis based on her history. No scalp or face involvement.   Past Medical History:  Diagnosis Date  . Anxiety   . Arthritis   . BMI 45.0-49.9, adult (HCC)   . Chronic asthma   . Chronic pain of right ankle   . Chronic rhinitis   . Eczema   . Migraines   . Smoker   . Snoring     Past Surgical History:  Procedure Laterality Date  . TYMPANOSTOMY TUBE PLACEMENT Bilateral     Family  History  Problem Relation Age of Onset  . Healthy Mother   . Eczema Sister   . Lupus Maternal Grandmother   . Heart disease Paternal Grandmother     Social History   Socioeconomic History  . Marital status: Single    Spouse name: n/a  . Number of children: 0  . Years of education: 12+  . Highest education level: Not on file  Occupational History  . Occupation: Set designer  Tobacco Use  . Smoking status: Current Every Day Smoker    Packs/day: 1.00    Types: Cigarettes  . Smokeless tobacco: Never Used  Vaping Use  . Vaping Use: Never used  Substance and Sexual Activity  . Alcohol use: No    Alcohol/week: 0.0 standard drinks  . Drug use: Yes    Frequency: 7.0 times per week    Types: Marijuana  . Sexual activity: Yes    Partners: Female  Other Topics Concern  . Not on file  Social History Narrative   Completing her second year at [employer] and plans to transfer to [employer].   Lives alone.   Family lives close by.       Social Determinants of Health   Financial Resource Strain:   . Difficulty of Paying Living Expenses: Not on file  Food Insecurity:   . Worried About Programme researcher, broadcasting/film/video in the Last Year: Not on file  .  Ran Out of Food in the Last Year: Not on file  Transportation Needs:   . Lack of Transportation (Medical): Not on file  . Lack of Transportation (Non-Medical): Not on file  Physical Activity:   . Days of Exercise per Week: Not on file  . Minutes of Exercise per Session: Not on file  Stress:   . Feeling of Stress : Not on file  Social Connections:   . Frequency of Communication with Friends and Family: Not on file  . Frequency of Social Gatherings with Friends and Family: Not on file  . Attends Religious Services: Not on file  . Active Member of Clubs or Organizations: Not on file  . Attends Banker Meetings: Not on file  . Marital Status: Not on file  Intimate Partner Violence:   . Fear of Current or Ex-Partner: Not on file  .  Emotionally Abused: Not on file  . Physically Abused: Not on file  . Sexually Abused: Not on file    ROS Review of Systems  Constitutional: Negative.   HENT: Negative.   Eyes: Negative.   Respiratory: Negative.   Cardiovascular: Negative.   Gastrointestinal: Negative.   Endocrine: Negative.   Genitourinary: Negative.   Musculoskeletal: Negative.   Skin: Negative.   Allergic/Immunologic: Negative.   Neurological: Negative.   Hematological: Negative.   Psychiatric/Behavioral: Negative.   All other systems reviewed and are negative.   Objective:   Today's Vitals: BP 127/84   Pulse 82   Temp 97.6 F (36.4 C) (Temporal)   Resp 18   Ht 5\' 1"  (1.549 m)   Wt 216 lb 12.8 oz (98.3 kg)   SpO2 99%   BMI 40.96 kg/m   Physical Exam Vitals and nursing note reviewed.  Constitutional:      General: She is not in acute distress.    Appearance: Normal appearance. She is obese. She is not ill-appearing, toxic-appearing or diaphoretic.  Cardiovascular:     Rate and Rhythm: Normal rate and regular rhythm.     Heart sounds: Normal heart sounds. No murmur heard.  No friction rub. No gallop.   Pulmonary:     Effort: Pulmonary effort is normal. No respiratory distress.     Breath sounds: No stridor. Wheezing (diffuse, consistent with hx of asthma) present. No rhonchi or rales.  Chest:     Chest wall: No tenderness.  Skin:    General: Skin is warm and dry.  Neurological:     General: No focal deficit present.     Mental Status: She is alert and oriented to person, place, and time. Mental status is at baseline.  Psychiatric:        Mood and Affect: Mood normal.        Behavior: Behavior normal.        Thought Content: Thought content normal.        Judgment: Judgment normal.     Assessment & Plan:   Problem List Items Addressed This Visit    None    Visit Diagnoses    Asthma, chronic, unspecified asthma severity, uncomplicated    -  Primary   Relevant Medications    beclomethasone (QVAR) 80 MCG/ACT inhaler   albuterol (VENTOLIN HFA) 108 (90 Base) MCG/ACT inhaler   albuterol (PROVENTIL) (2.5 MG/3ML) 0.083% nebulizer solution   Other Relevant Orders   For home use only DME Nebulizer machine   Family history of lupus erythematosus       Relevant Orders   ANA  Screening for endocrine, metabolic and immunity disorder       Relevant Orders   CBC With Differential   Comprehensive metabolic panel   TSH   Hemoglobin A1c   Lipid screening       Relevant Orders   Lipid panel   Screening for viral disease       Relevant Orders   Hepatitis C antibody   Carpal tunnel syndrome, bilateral upper limbs       Relevant Orders   Ambulatory referral to Hand Surgery      Outpatient Encounter Medications as of 09/30/2019  Medication Sig  . Acetaminophen (TYLENOL ARTHRITIS PAIN PO) Take by mouth.  Marland Kitchen albuterol (VENTOLIN HFA) 108 (90 Base) MCG/ACT inhaler Inhale 1-2 puffs into the lungs every 6 (six) hours as needed for wheezing or shortness of breath.  . beclomethasone (QVAR) 80 MCG/ACT inhaler Inhale 1 puff into the lungs in the morning and at bedtime.  . gabapentin (NEURONTIN) 300 MG capsule Take 1 capsule (300 mg total) by mouth daily.  Marland Kitchen triamcinolone cream (KENALOG) 0.1 % Apply 1 application topically 2 (two) times daily.  . [DISCONTINUED] albuterol (VENTOLIN HFA) 108 (90 Base) MCG/ACT inhaler Inhale 1-2 puffs into the lungs every 6 (six) hours as needed for wheezing or shortness of breath.  . [DISCONTINUED] beclomethasone (QVAR) 80 MCG/ACT inhaler Inhale 1 puff into the lungs in the morning and at bedtime.  . [DISCONTINUED] BREO ELLIPTA 200-25 MCG/INH AEPB Please specify directions, refills and quantity  . [DISCONTINUED] predniSONE (DELTASONE) 50 MG tablet Take 1 tablet (50 mg total) by mouth daily with breakfast.  . albuterol (PROVENTIL) (2.5 MG/3ML) 0.083% nebulizer solution Take 3 mLs (2.5 mg total) by nebulization every 6 (six) hours as needed for wheezing  or shortness of breath.   No facility-administered encounter medications on file as of 09/30/2019.    Follow-up: No follow-ups on file.   PLAN  Refer to hand surgery for carpal tunnel  Refill QVar and triamcinolone  Order for DME for nebulizer given  Suggest lactaid tablets and exclusion diet for lactose intolerance  Labs drawn, will follow up as warranted  Patient encouraged to call clinic with any questions, comments, or concerns.  Janeece Agee, NP

## 2019-10-01 LAB — LIPID PANEL
Chol/HDL Ratio: 4.4 ratio (ref 0.0–4.4)
Cholesterol, Total: 151 mg/dL (ref 100–199)
HDL: 34 mg/dL — ABNORMAL LOW (ref >39)
LDL Chol Calc (NIH): 97 mg/dL (ref 0–99)
Triglycerides: 107 mg/dL (ref 0–149)
VLDL Cholesterol Cal: 20 mg/dL (ref 5–40)

## 2019-10-01 LAB — COMPREHENSIVE METABOLIC PANEL
ALT: 16 IU/L (ref 0–32)
AST: 11 IU/L (ref 0–40)
Albumin/Globulin Ratio: 1.7 (ref 1.2–2.2)
Albumin: 4.3 g/dL (ref 3.8–4.8)
Alkaline Phosphatase: 86 IU/L (ref 48–121)
BUN/Creatinine Ratio: 9 (ref 9–23)
BUN: 7 mg/dL (ref 6–20)
Bilirubin Total: 0.4 mg/dL (ref 0.0–1.2)
CO2: 19 mmol/L — ABNORMAL LOW (ref 20–29)
Calcium: 9.3 mg/dL (ref 8.7–10.2)
Chloride: 107 mmol/L — ABNORMAL HIGH (ref 96–106)
Creatinine, Ser: 0.78 mg/dL (ref 0.57–1.00)
GFR calc Af Amer: 116 mL/min/{1.73_m2} (ref >59)
GFR calc non Af Amer: 101 mL/min/{1.73_m2} (ref >59)
Globulin, Total: 2.6 g/dL (ref 1.5–4.5)
Glucose: 109 mg/dL — ABNORMAL HIGH (ref 65–99)
Potassium: 4.5 mmol/L (ref 3.5–5.2)
Sodium: 139 mmol/L (ref 134–144)
Total Protein: 6.9 g/dL (ref 6.0–8.5)

## 2019-10-01 LAB — CBC WITH DIFFERENTIAL
Basophils Absolute: 0 10*3/uL (ref 0.0–0.2)
Basos: 0 %
EOS (ABSOLUTE): 0.3 10*3/uL (ref 0.0–0.4)
Eos: 3 %
Hematocrit: 41.4 % (ref 34.0–46.6)
Hemoglobin: 13.9 g/dL (ref 11.1–15.9)
Immature Grans (Abs): 0.1 10*3/uL (ref 0.0–0.1)
Immature Granulocytes: 1 %
Lymphocytes Absolute: 1.9 10*3/uL (ref 0.7–3.1)
Lymphs: 21 %
MCH: 29.6 pg (ref 26.6–33.0)
MCHC: 33.6 g/dL (ref 31.5–35.7)
MCV: 88 fL (ref 79–97)
Monocytes Absolute: 0.5 10*3/uL (ref 0.1–0.9)
Monocytes: 6 %
Neutrophils Absolute: 6.4 10*3/uL (ref 1.4–7.0)
Neutrophils: 69 %
RBC: 4.7 x10E6/uL (ref 3.77–5.28)
RDW: 12.8 % (ref 11.7–15.4)
WBC: 9.2 10*3/uL (ref 3.4–10.8)

## 2019-10-01 LAB — HEMOGLOBIN A1C
Est. average glucose Bld gHb Est-mCnc: 137 mg/dL
Hgb A1c MFr Bld: 6.4 % — ABNORMAL HIGH (ref 4.8–5.6)

## 2019-10-01 LAB — TSH: TSH: 2.27 u[IU]/mL (ref 0.450–4.500)

## 2019-10-01 LAB — ANA: Anti Nuclear Antibody (ANA): NEGATIVE

## 2019-10-01 LAB — HEPATITIS C ANTIBODY: Hep C Virus Ab: <0.1 s/co ratio (ref 0.0–0.9)

## 2019-10-03 ENCOUNTER — Encounter: Payer: Self-pay | Admitting: Registered Nurse

## 2019-10-18 ENCOUNTER — Encounter: Payer: Self-pay | Admitting: Orthopaedic Surgery

## 2019-10-18 ENCOUNTER — Ambulatory Visit (INDEPENDENT_AMBULATORY_CARE_PROVIDER_SITE_OTHER): Payer: Managed Care, Other (non HMO) | Admitting: Orthopaedic Surgery

## 2019-10-18 DIAGNOSIS — G5603 Carpal tunnel syndrome, bilateral upper limbs: Secondary | ICD-10-CM | POA: Diagnosis not present

## 2019-10-18 NOTE — Addendum Note (Signed)
Addended byPrescott Parma on: 10/18/2019 10:29 AM   Modules accepted: Orders

## 2019-10-18 NOTE — Progress Notes (Signed)
Office Visit Note   Patient: Karen Jensen           Date of Birth: Aug 15, 1985           MRN: [id number] Visit Date: 10/18/2019              Requested by: Janeece Agee, NP [address] [place],  [place] [address] PCP: Janeece Agee, NP   Assessment & Plan:      Visit Diagnoses:  1. Bilateral carpal tunnel syndrome     Plan: We will obtain EMG nerve conduction needs bilateral upper extremities evaluate for carpal tunnel syndrome.  Have her follow-up after the studies to go over the results and discuss further treatment.  Questions were encouraged and answered by Dr. Magnus Ivan and myself.  Follow-Up Instructions: Return After EMG/Gordon studies.   Orders:  No orders of the defined types were placed in this encounter.  No orders of the defined types were placed in this encounter.     Procedures: No procedures performed   Clinical Data: No additional findings.   Subjective: Chief Complaint  Patient presents with  . Right Hand - Pain, Numbness  . Left Hand - Pain, Numbness    HPI Patient is a 34 year old female with numbness tingling in both hands for the last 3 years that is becoming worse.  She states that her right hand is worse than the left she is right-hand dominant.  She does wear splints at night predominantly on the right hand.  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  She has trouble opening jars of the cans especially with the right hand.  Pain in the right hand wakes her up at night and does radiate up the arm into the the humerus area.  She has tried gabapentin oral steroids and Tylenol.  Not having difficulty sleeping due to the pain in her right hand.  Notes that her right hand is asleep whenever she is driving.  Occasionally the left hand does go numb.  Denies any trauma.  Denies any neck pain.  Review of Systems Negative for fevers chills.  Objective: Vital Signs: There were no vitals taken for this visit.  Physical  Exam Constitutional:      Appearance: She is not ill-appearing or diaphoretic.  Cardiovascular:     Pulses: Normal pulses.  Neurological:     Mental Status: She is alert.  Psychiatric:        Mood and Affect: Mood normal.     Ortho Exam Bilateral hands subjective decreased sensation throughout the median distribution of the right hand and left index and middle finger.  Full motor bilateral hands.  She has a callus formations tips of multiple fingers both hands.  More on the right.  Positive Tinel's of the median nerve on the right negative on the left.  Compression test positive on the right negative on the left.  Phalen's test positive on the right negative on the left.  Cervical spine full range of motion without pain or radicular symptoms. Specialty Comments:  No specialty comments available.  Imaging: No results found.   PMFS History: Patient Active Problem List   Diagnosis Date Noted  . Smoker 04/27/2019  . Anxiety 06/24/2018  . Chronic rhinitis 06/24/2018  . BMI 45.0-49.9, adult (HCC) 06/24/2018  . Migraine 01/24/2015  . Asthma, chronic 01/24/2015  . Carpal tunnel syndrome 01/24/2015   Past Medical History:  Diagnosis Date  . Anxiety   . Arthritis   . BMI 45.0-49.9, adult (  HCC)   . Chronic asthma   . Chronic pain of right ankle   . Chronic rhinitis   . Eczema   . Migraines   . Smoker   . Snoring     Family History  Problem Relation Age of Onset  . Healthy Mother   . Eczema Sister   . Lupus Maternal Grandmother   . Heart disease Paternal Grandmother     Past Surgical History:  Procedure Laterality Date  . TYMPANOSTOMY TUBE PLACEMENT Bilateral    Social History   Occupational History  . Occupation: Set designer  Tobacco Use  . Smoking status: Current Every Day Smoker    Packs/day: 1.00    Types: Cigarettes  . Smokeless tobacco: Never Used  Vaping Use  . Vaping Use: Never used  Substance and Sexual Activity  . Alcohol use: No    Alcohol/week:  0.0 standard drinks  . Drug use: Yes    Frequency: 7.0 times per week    Types: Marijuana  . Sexual activity: Yes    Partners: Female

## 2019-10-23 ENCOUNTER — Other Ambulatory Visit: Payer: Self-pay | Admitting: Registered Nurse

## 2019-10-23 DIAGNOSIS — J45909 Unspecified asthma, uncomplicated: Secondary | ICD-10-CM

## 2019-10-23 NOTE — Telephone Encounter (Signed)
Requested Prescriptions  Pending Prescriptions Disp Refills  . albuterol (VENTOLIN HFA) 108 (90 Base) MCG/ACT inhaler [Pharmacy Med Name: ALBUTEROL HFA (VENTOLIN) INH] 18 each 0    Sig: INHALE 1-2 PUFFS INTO THE LUNGS EVERY 6 (SIX) HOURS AS NEEDED FOR WHEEZING OR SHORTNESS OF BREATH.     Pulmonology:  Beta Agonists Failed - 10/23/2019  4:24 AM      Failed - One inhaler should last at least one month. If the patient is requesting refills earlier, contact the patient to check for uncontrolled symptoms.      Passed - Valid encounter within last 12 months    Recent Outpatient Visits          3 weeks ago Asthma, chronic, unspecified asthma severity, uncomplicated   [facility], Gerlene Burdock, NP   4 years ago Lower abdominal pain   Primary Care at [facility], [place], [place]   4 years ago Headache, unspecified headache type   Primary Care at [facility], [place], [place]   5 years ago Pain, dental   [facility], Dema Severin, PA-C   5 years ago Acute recurrent maxillary sinusitis   [facility], Levell July, MD      Future Appointments            In 2 months Janeece Agee, NP Primary Care at [place], [facility]

## 2019-10-28 ENCOUNTER — Other Ambulatory Visit: Payer: Self-pay

## 2019-10-28 ENCOUNTER — Encounter: Payer: Self-pay | Admitting: Physical Medicine and Rehabilitation

## 2019-10-28 ENCOUNTER — Ambulatory Visit: Payer: Managed Care, Other (non HMO) | Admitting: Physical Medicine and Rehabilitation

## 2019-10-28 DIAGNOSIS — R202 Paresthesia of skin: Secondary | ICD-10-CM | POA: Diagnosis not present

## 2019-10-28 NOTE — Progress Notes (Signed)
Pain in right arm. Was just in forearm, but now has pain up to shoulder. Worse on right than left.  Right hand dominant No lotion per patient Numeric Pain Rating Scale and Functional Assessment Average Pain 8   In the last MONTH (on 0-10 scale) has pain interfered with the following?  1. General activity like being  able to carry out your everyday physical activities such as walking, climbing stairs, carrying groceries, or moving a chair?  Rating(8)

## 2019-11-02 NOTE — Procedures (Signed)
EMG & NCV Findings: Evaluation of the left median motor and the right median motor nerves showed prolonged distal onset latency (L5.0, R7.5 ms) and decreased conduction velocity (Elbow-Wrist, L48, R45 m/s).  The left median (across palm) sensory nerve showed no response (Palm), prolonged distal peak latency (6.0 ms), and reduced amplitude (5.4 V).  The right median (across palm) sensory nerve showed prolonged distal peak latency (Wrist, 4.7 ms), reduced amplitude (4.3 V), and prolonged distal peak latency (Palm, 4.1 ms).  All remaining nerves (as indicated in the following tables) were within normal limits.  Left vs. Right side comparison data for the median motor nerve indicates abnormal L-R latency difference (2.5 ms).    All examined muscles (as indicated in the following table) showed no evidence of electrical instability.    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   There is no significant electrodiagnostic evidence of any other focal nerve entrapment, brachial plexopathy or cervical radiculopathy.   Recommendations: 1.  Follow-up with referring physician. 2.  Continue current management of symptoms. 3.  Continue use of resting splint at night-time and as needed during the day. 4.  Suggest surgical evaluation.  ___________________________ Karen Jensen FAAPMR Board Certified, American Board of Physical Medicine and Rehabilitation    Nerve Conduction Studies Anti Sensory Summary Table   Stim Site NR Peak (ms) Norm Peak (ms) P-T Amp (V) Norm P-T Amp Site1 Site2 Delta-P (ms) Dist (cm) Vel (m/s) Norm Vel (m/s)  Left Median Acr Palm Anti Sensory (2nd Digit)  32.2C  Wrist    *6.0 <3.6 *5.4 >10 Wrist Palm  0.0    Palm *NR  <2.0          Right Median Acr Palm Anti Sensory (2nd Digit)  31.7C  Wrist    *4.7 <3.6 *4.3  >10 Wrist Palm 0.6 0.0    Palm    *4.1 <2.0 1.8         Right Radial Anti Sensory (Base 1st Digit)  31.5C  Wrist    2.0 <3.1 22.0  Wrist Base 1st Digit 2.0 0.0    Right Ulnar Anti Sensory (5th Digit)  31.9C  Wrist    2.9 <3.7 22.7 >15.0 Wrist 5th Digit 2.9 14.0 48 >38   Motor Summary Table   Stim Site NR Onset (ms) Norm Onset (ms) O-P Amp (mV) Norm O-P Amp Site1 Site2 Delta-0 (ms) Dist (cm) Vel (m/s) Norm Vel (m/s)  Left Median Motor (Abd Poll Brev)  32.8C  Wrist    *5.0 <4.2 8.2 >5 Elbow Wrist 4.0 19.0 *48 >50  Elbow    9.0  7.8         Right Median Motor (Abd Poll Brev)  31.9C  Wrist    *7.5 <4.2 6.2 >5 Elbow Wrist 4.4 20.0 *45 >50  Elbow    11.9  5.8         Right Ulnar Motor (Abd Dig Min)  32.1C  Wrist    2.7 <4.2 6.2 >3 B Elbow Wrist 3.0 20.0 67 >53  B Elbow    5.7  8.1  A Elbow B Elbow 1.2 10.0 83 >53  A Elbow    6.9  7.9          EMG   Side Muscle Nerve Root Ins Act Fibs Psw Amp Dur Poly Recrt Int Dennie Bible Comment  Right Abd [employer]  Median C8-T1 Nml Nml Nml Nml Nml 0 Nml Nml   Right 1stDorInt Ulnar C8-T1 Nml Nml Nml Nml Nml 0 Nml Nml   Right PronatorTeres Median C6-7 Nml Nml Nml Nml Nml 0 Nml Nml   Right Biceps Musculocut C5-6 Nml Nml Nml Nml Nml 0 Nml Nml   Right Deltoid Axillary C5-6 Nml Nml Nml Nml Nml 0 Nml Nml     Nerve Conduction Studies Anti Sensory Left/Right Comparison   Stim Site L Lat (ms) R Lat (ms) L-R Lat (ms) L Amp (V) R Amp (V) L-R Amp (%) Site1 Site2 L Vel (m/s) R Vel (m/s) L-R Vel (m/s)  Median Acr Palm Anti Sensory (2nd Digit)  32.2C  Wrist *6.0 *4.7 1.3 *5.4 *4.3 20.4 Wrist Palm     Palm  *4.1   1.8        Radial Anti Sensory (Base 1st Digit)  31.5C  Wrist  2.0   22.0  Wrist Base 1st Digit     Ulnar Anti Sensory (5th Digit)  31.9C  Wrist  2.9   22.7  Wrist 5th Digit  48    Motor Left/Right Comparison   Stim Site L Lat (ms) R Lat (ms) L-R Lat (ms) L Amp (mV) R Amp (mV) L-R Amp (%) Site1 Site2 L Vel (m/s) R Vel (m/s) L-R Vel (m/s)  Median  Motor (Abd Poll Brev)  32.8C  Wrist *5.0 *7.5 *2.5 8.2 6.2 24.4 Elbow Wrist *48 *45 3  Elbow 9.0 11.9 2.9 7.8 5.8 25.6       Ulnar Motor (Abd Dig Min)  32.1C  Wrist  2.7   6.2  B Elbow Wrist  67   B Elbow  5.7   8.1  A Elbow B Elbow  83   A Elbow  6.9   7.9           Waveforms:

## 2019-11-08 ENCOUNTER — Ambulatory Visit: Payer: Managed Care, Other (non HMO) | Admitting: Orthopaedic Surgery

## 2019-11-18 ENCOUNTER — Encounter: Payer: Self-pay | Admitting: Registered Nurse

## 2019-11-27 NOTE — Progress Notes (Signed)
Karen Jensen - 34 y.o. female MRN [id number]  Date of birth: 01-15-1986  Office Visit Note: Visit Date: 10/28/2019 PCP: Janeece Agee, NP Referred by: Janeece Agee, NP  Subjective: Chief Complaint  Patient presents with  . Right Arm - Pain   HPI:  Karen Jensen is a 34 y.o. female who comes in today at the request of Dr. Doneen Poisson for electrodiagnostic study of the Bilateral upper extremities.  Patient is Right hand dominant.  She reports pain in the right arm and it was just the forearm but now radiating up to the shoulder.  Symptoms are worse on the right than left but bilateral.  She has had worsening symptoms over the last 3 years.  She states that her right hand is worse than the left she is right-hand dominant.  She does wear splints at night predominantly on the right hand.  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    Pain in the right hand wakes her up at night and does radiate up the arm into the the humerus area.  She states that her right hand is asleep whenever she is driving.  He has no prior electrodiagnostic studies.  ROS Otherwise per HPI.  Assessment & Plan: Visit Diagnoses:  1. 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   There is no significant electrodiagnostic evidence of any other focal nerve entrapment, brachial plexopathy or cervical radiculopathy.   Recommendations: 1.  Follow-up with referring physician. 2.  Continue current management of symptoms. 3.  Continue use of resting splint at night-time and as needed during the day. 4.  Suggest surgical evaluation.  Meds & Orders: No orders of the defined types were placed in this encounter.   Orders Placed This Encounter  Procedures  . NCV with EMG (electromyography)      Follow-up: Return for Doneen Poisson, MD.   Procedures: No procedures performed  EMG & NCV Findings: Evaluation of the left median motor and the right median motor nerves showed prolonged distal onset latency (L5.0, R7.5 ms) and decreased conduction velocity (Elbow-Wrist, L48, R45 m/s).  The left median (across palm) sensory nerve showed no response (Palm), prolonged distal peak latency (6.0 ms), and reduced amplitude (5.4 V).  The right median (across palm) sensory nerve showed prolonged distal peak latency (Wrist, 4.7 ms), reduced amplitude (4.3 V), and prolonged distal peak latency (Palm, 4.1 ms).  All remaining nerves (as indicated in the following tables) were within normal limits.  Left vs. Right side comparison data for the median motor nerve indicates abnormal L-R latency difference (2.5 ms).    All examined muscles (as indicated in the following table) showed no evidence of electrical instability.    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   There is no significant electrodiagnostic evidence of any other focal nerve entrapment, brachial plexopathy or cervical radiculopathy.   Recommendations: 1.  Follow-up with referring physician. 2.  Continue current management of symptoms. 3.  Continue use of resting splint at night-time and as needed during the day. 4.  Suggest surgical evaluation.  ___________________________ Elease Hashimoto Board Certified, American Board of Physical Medicine and Rehabilitation    Nerve Conduction  Studies Anti Sensory Summary Table   Stim Site NR Peak (ms) Norm Peak (ms) P-T Amp (V) Norm P-T Amp Site1 Site2 Delta-P (ms) Dist (cm) Vel (m/s) Norm Vel (m/s)  Left Median Acr Palm Anti Sensory (2nd Digit)  32.2C  Wrist    *6.0 <3.6 *5.4 >10 Wrist Palm  0.0    Palm *NR   <2.0          Right Median Acr Palm Anti Sensory (2nd Digit)  31.7C  Wrist    *4.7 <3.6 *4.3 >10 Wrist Palm 0.6 0.0    Palm    *4.1 <2.0 1.8         Right Radial Anti Sensory (Base 1st Digit)  31.5C  Wrist    2.0 <3.1 22.0  Wrist Base 1st Digit 2.0 0.0    Right Ulnar Anti Sensory (5th Digit)  31.9C  Wrist    2.9 <3.7 22.7 >15.0 Wrist 5th Digit 2.9 14.0 48 >38   Motor Summary Table   Stim Site NR Onset (ms) Norm Onset (ms) O-P Amp (mV) Norm O-P Amp Site1 Site2 Delta-0 (ms) Dist (cm) Vel (m/s) Norm Vel (m/s)  Left Median Motor (Abd Poll Brev)  32.8C  Wrist    *5.0 <4.2 8.2 >5 Elbow Wrist 4.0 19.0 *48 >50  Elbow    9.0  7.8         Right Median Motor (Abd Poll Brev)  31.9C  Wrist    *7.5 <4.2 6.2 >5 Elbow Wrist 4.4 20.0 *45 >50  Elbow    11.9  5.8         Right Ulnar Motor (Abd Dig Min)  32.1C  Wrist    2.7 <4.2 6.2 >3 B Elbow Wrist 3.0 20.0 67 >53  B Elbow    5.7  8.1  A Elbow B Elbow 1.2 10.0 83 >53  A Elbow    6.9  7.9          EMG   Side Muscle Nerve Root Ins Act Fibs Psw Amp Dur Poly Recrt Int Dennie Bible Comment  Right Abd Poll Brev Median C8-T1 Nml Nml Nml Nml Nml 0 Nml Nml   Right 1stDorInt Ulnar C8-T1 Nml Nml Nml Nml Nml 0 Nml Nml   Right PronatorTeres Median C6-7 Nml Nml Nml Nml Nml 0 Nml Nml   Right Biceps Musculocut C5-6 Nml Nml Nml Nml Nml 0 Nml Nml   Right Deltoid Axillary C5-6 Nml Nml Nml Nml Nml 0 Nml Nml     Nerve Conduction Studies Anti Sensory Left/Right Comparison   Stim Site L Lat (ms) R Lat (ms) L-R Lat (ms) L Amp (V) R Amp (V) L-R Amp (%) Site1 Site2 L Vel (m/s) R Vel (m/s) L-R Vel (m/s)  Median Acr Palm Anti Sensory (2nd Digit)  32.2C  Wrist *6.0 *4.7 1.3 *5.4 *4.3 20.4 Wrist Palm     Palm  *4.1   1.8        Radial Anti Sensory (Base 1st Digit)  31.5C  Wrist  2.0   22.0  Wrist Base 1st Digit     Ulnar Anti Sensory (5th Digit)  31.9C  Wrist  2.9   22.7  Wrist 5th Digit  48    Motor Left/Right Comparison   Stim Site L Lat (ms) R Lat (ms) L-R Lat  (ms) L Amp (mV) R Amp (mV) L-R Amp (%) Site1 Site2 L Vel (m/s) R Vel (m/s) L-R Vel (m/s)  Median Motor (Abd Poll Brev)  32.8C  Wrist *  5.0 *7.5 *2.5 8.2 6.2 24.4 Elbow Wrist *48 *45 3  Elbow 9.0 11.9 2.9 7.8 5.8 25.6       Ulnar Motor (Abd Dig Min)  32.1C  Wrist  2.7   6.2  B Elbow Wrist  67   B Elbow  5.7   8.1  A Elbow B Elbow  83   A Elbow  6.9   7.9           Waveforms:                 Clinical History: No specialty comments available.     Objective:  VS:  HT:    WT:   BMI:     BP:   HR: bpm  TEMP: ( )  RESP:  Physical Exam Musculoskeletal:        General: No swelling, tenderness or deformity.     Comments: Inspection reveals no atrophy of the bilateral APB or FDI or hand intrinsics. There is no swelling, color changes, allodynia or dystrophic changes. There is 5 out of 5 strength in the bilateral wrist extension, finger abduction and long finger flexion. Impaired sensation in the right median nerve distribution.  There is a positive Phalen's test on the right. There is a negative Hoffmann's test bilaterally.  Skin:    General: Skin is warm and dry.     Findings: No erythema or rash.  Neurological:     General: No focal deficit present.     Mental Status: She is alert and oriented to person, place, and time.     Motor: No weakness or abnormal muscle tone.     Coordination: Coordination normal.  Psychiatric:        Mood and Affect: Mood normal.        Behavior: Behavior normal.      Imaging: No results found.

## 2019-12-09 ENCOUNTER — Encounter: Payer: Managed Care, Other (non HMO) | Admitting: Physical Medicine and Rehabilitation

## 2019-12-10 IMAGING — CR DG ANKLE COMPLETE 3+V*L*
3 series · 3 of 3 positions shown · non-contrast
Comparison: None.

CLINICAL DATA: Pain after trauma

EXAM:
LEFT ANKLE COMPLETE - 3+ VIEW

[x ankle ap left]
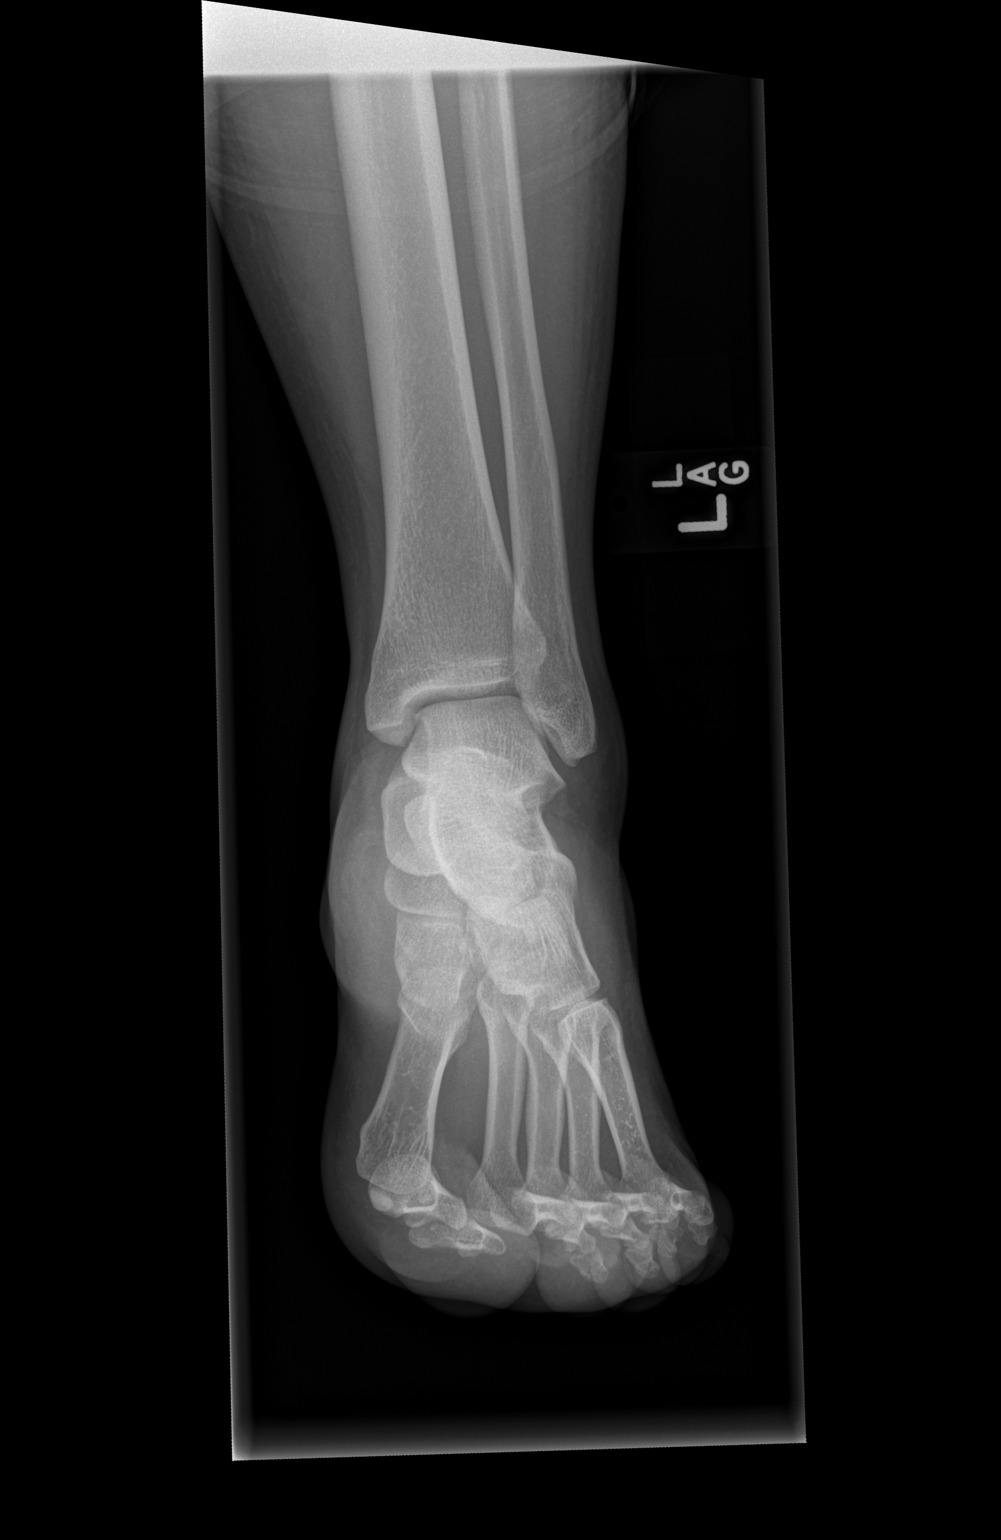

[x ankle obl left]
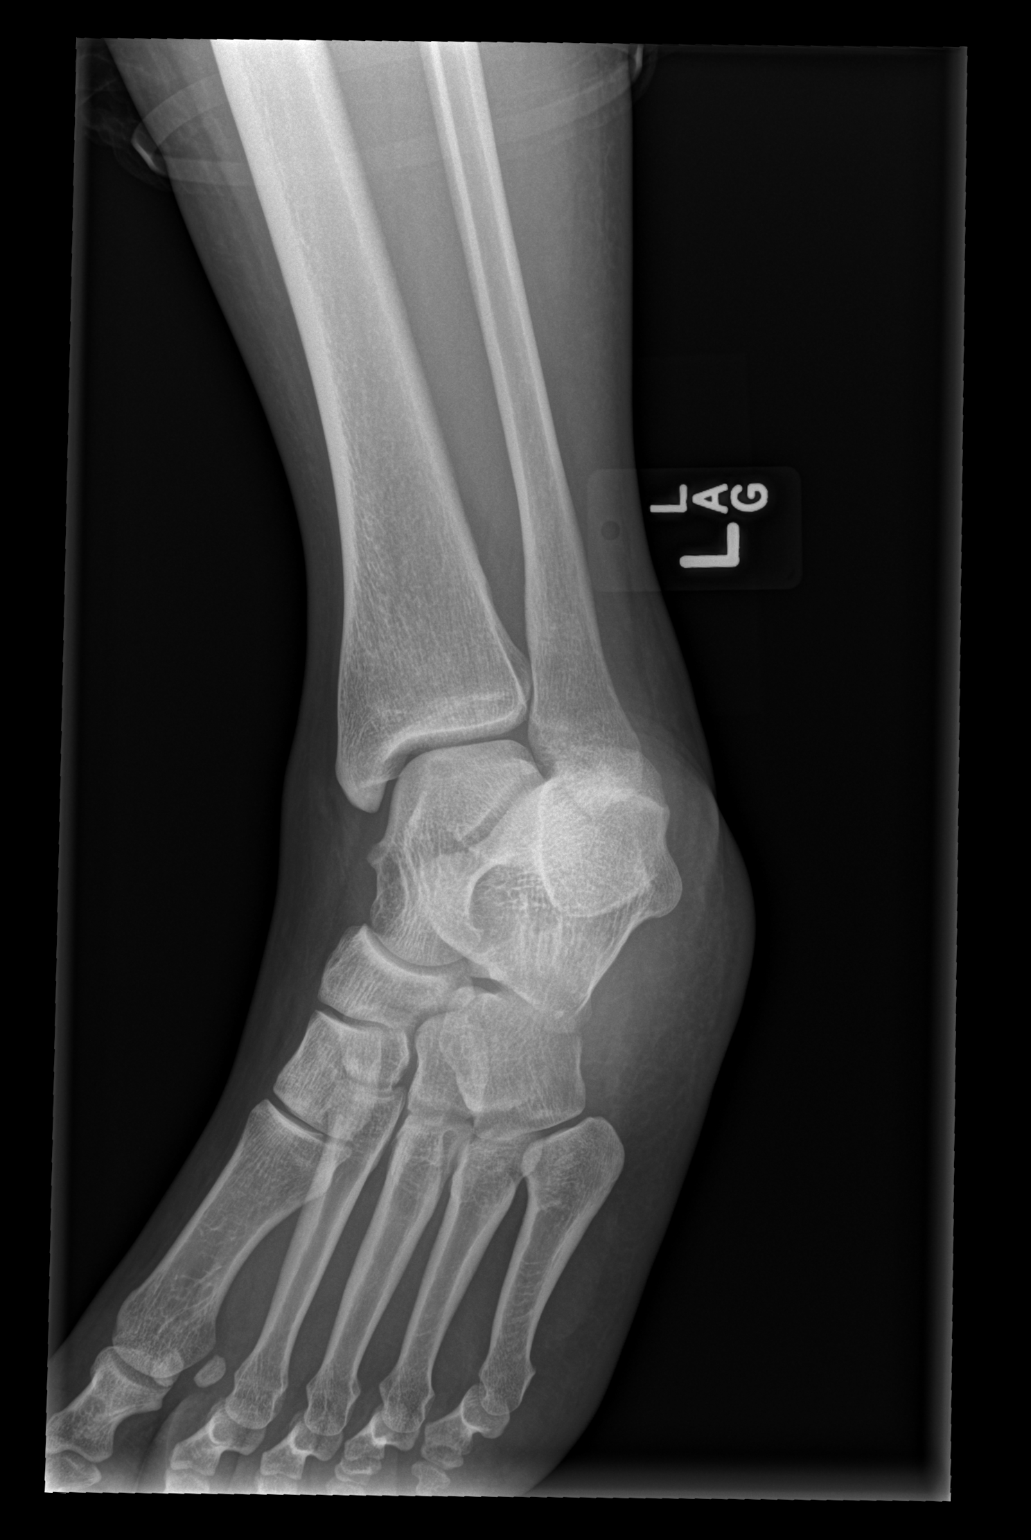

[x ankle lat left]
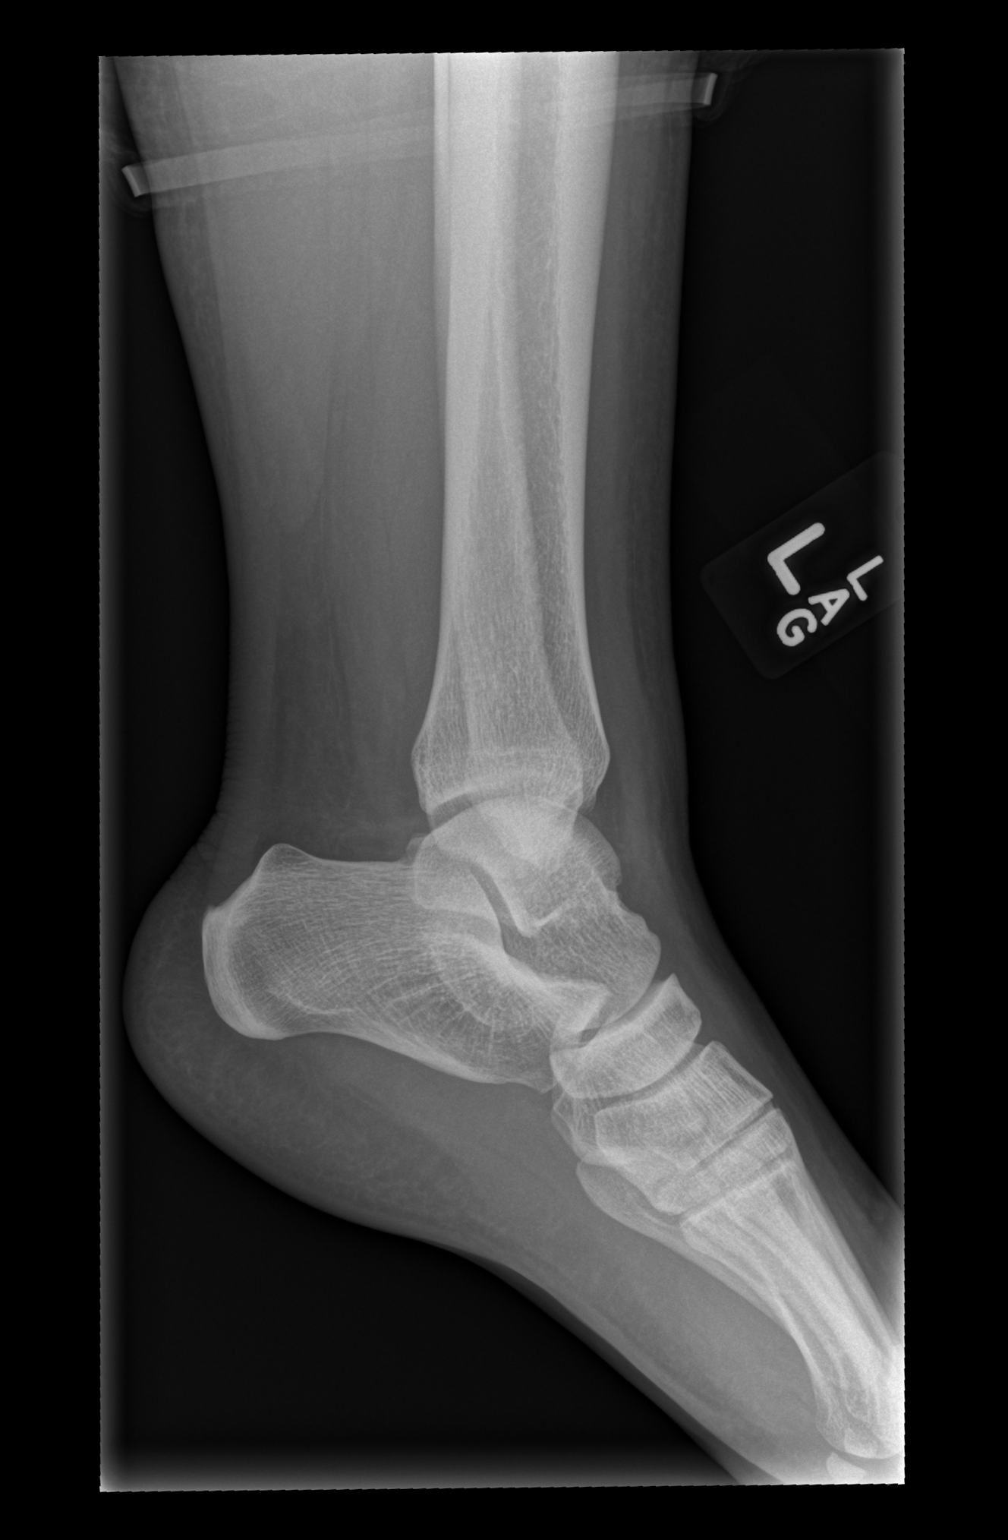

[3 of 3 positions shown; findings below may reference images not displayed]

FINDINGS: There is no evidence of fracture, dislocation, or joint effusion.
There is no evidence of arthropathy or other focal bone abnormality.
Soft tissues are unremarkable.
IMPRESSION: Negative.

## 2019-12-20 ENCOUNTER — Telehealth: Payer: Self-pay

## 2019-12-20 ENCOUNTER — Other Ambulatory Visit: Payer: Self-pay | Admitting: Registered Nurse

## 2019-12-20 DIAGNOSIS — L309 Dermatitis, unspecified: Secondary | ICD-10-CM

## 2019-12-20 NOTE — Telephone Encounter (Signed)
Verbal given to pharmacy over the phone

## 2019-12-20 NOTE — Telephone Encounter (Signed)
Patient is requesting changes in Rx- sent for PCP review and change of Rx. Wants jar- not tube

## 2019-12-20 NOTE — Telephone Encounter (Signed)
Patient experiencing eczema flare up for 2 weeks and requesting the jar not the tube of  triamcinolone cream (KENALOG) 0.1 % , patient would like request expedited  [facility]  [address]  [place] [address]  [phone]

## 2019-12-21 NOTE — Telephone Encounter (Signed)
I have called the pt and informed that we can not just send her some cream for her blisters. We have evaluate what is going on with her skin and see what medication or cream can help with it. She has an appointment to be seen on Friday 12/30/19. I have informed her to make sure she continues to put coconut oil on her skin if it is helping and we will be happy to see her at her appointment.   She stated understanding.

## 2019-12-21 NOTE — Telephone Encounter (Signed)
Pt called about her refill for Kenalog Cream /Pt stated she is starting to form blisters and really need Rx asap/ Pt states the tube is not enough to use on her entire body/ advised pt of PA needed/ please advise what Pt can use until PA is done

## 2019-12-30 ENCOUNTER — Ambulatory Visit (INDEPENDENT_AMBULATORY_CARE_PROVIDER_SITE_OTHER): Payer: Managed Care, Other (non HMO) | Admitting: Registered Nurse

## 2019-12-30 ENCOUNTER — Encounter: Payer: Self-pay | Admitting: Registered Nurse

## 2019-12-30 ENCOUNTER — Other Ambulatory Visit: Payer: Self-pay

## 2019-12-30 VITALS — BP 122/87 | HR 84 | Temp 98.2°F | Resp 18 | Ht 61.0 in | Wt 209.0 lb

## 2019-12-30 DIAGNOSIS — Z8639 Personal history of other endocrine, nutritional and metabolic disease: Secondary | ICD-10-CM

## 2019-12-30 DIAGNOSIS — L309 Dermatitis, unspecified: Secondary | ICD-10-CM

## 2019-12-30 LAB — POCT GLYCOSYLATED HEMOGLOBIN (HGB A1C): Hemoglobin A1C: 5.8 % — AB (ref 4.0–5.6)

## 2019-12-30 MED ORDER — TRIAMCINOLONE ACETONIDE 0.1 % EX CREA: 1.0000 "application " | TOPICAL_CREAM | Freq: Two times a day (BID) | CUTANEOUS | 2 refills | Status: DC

## 2019-12-30 NOTE — Progress Notes (Signed)
Established Patient Office Visit  Subjective:  Patient ID: Karen Jensen, female    DOB: [birth year]  Age: 34 y.o. MRN: [id number]  CC:  Chief Complaint  Patient presents with  . Follow-up    Patient states she is here for an follow up for diabetes and discuss medication.    HPI Karen Jensen presents for t2dm follow up  Last A1c:  6.4   Currently taking: no medications for glucose control. Trying to control with diet and exercise No new complications Reports good compliance with lifestyle Diet has been mostly good, more veggies, conscious of sweets and processed foods. Exercise habits have been inconsistent at times.   Requesting refill on triamcinolone. Uses for eczema. Worse during winter. Needs larger disp volume if possible.  Past Medical History:  Diagnosis Date  . Anxiety   . Arthritis   . BMI 45.0-49.9, adult (HCC)   . Chronic asthma   . Chronic pain of right ankle   . Chronic rhinitis   . Eczema   . Migraines   . Smoker   . Snoring     Past Surgical History:  Procedure Laterality Date  . TYMPANOSTOMY TUBE PLACEMENT Bilateral     Family History  Problem Relation Age of Onset  . Healthy Mother   . Eczema Sister   . Lupus Maternal Grandmother   . Heart disease Paternal Grandmother     Social History   Socioeconomic History  . Marital status: Single    Spouse name: n/a  . Number of children: 0  . Years of education: 12+  . Highest education level: Not on file  Occupational History  . Occupation: Set designer  Tobacco Use  . Smoking status: Current Every Day Smoker    Packs/day: 1.00    Types: Cigarettes  . Smokeless tobacco: Never Used  Vaping Use  . Vaping Use: Never used  Substance and Sexual Activity  . Alcohol use: No    Alcohol/week: 0.0 standard drinks  . Drug use: Yes    Frequency: 7.0 times per week    Types: Marijuana  . Sexual activity: Yes    Partners: Female  Other Topics Concern  . Not on file  Social  History Narrative   Completing her second year at [employer] and plans to transfer to [employer].   Lives alone.   Family lives close by.       Social Determinants of Health   Financial Resource Strain: Not on file  Food Insecurity: Not on file  Transportation Needs: Not on file  Physical Activity: Not on file  Stress: Not on file  Social Connections: Not on file  Intimate Partner Violence: Not on file    Outpatient Medications Prior to Visit  Medication Sig Dispense Refill  . Acetaminophen (TYLENOL ARTHRITIS PAIN PO) Take by mouth.    Marland Kitchen albuterol (PROVENTIL) (2.5 MG/3ML) 0.083% nebulizer solution Take 3 mLs (2.5 mg total) by nebulization every 6 (six) hours as needed for wheezing or shortness of breath. 150 mL 1  . albuterol (VENTOLIN HFA) 108 (90 Base) MCG/ACT inhaler INHALE 1-2 PUFFS INTO THE LUNGS EVERY 6 (SIX) HOURS AS NEEDED FOR WHEEZING OR SHORTNESS OF BREATH. 18 each 0  . beclomethasone (QVAR) 80 MCG/ACT inhaler Inhale 1 puff into the lungs in the morning and at bedtime. 1 each 3  . gabapentin (NEURONTIN) 300 MG capsule Take 1 capsule (300 mg total) by mouth daily. 30 capsule 1  . QVAR REDIHALER 80 MCG/ACT inhaler SMARTSIG:1 Puff(s) Via Inhaler  Morning-Night    . triamcinolone cream (KENALOG) 0.1 % Apply 1 application topically 2 (two) times daily. 453.6 g 2   No facility-administered medications prior to visit.    Allergies  Allergen Reactions  . Other     WOOL produces rash    ROS Review of Systems  Constitutional: Negative.   HENT: Negative.   Eyes: Negative.   Respiratory: Negative.   Cardiovascular: Negative.   Gastrointestinal: Negative.   Genitourinary: Negative.   Musculoskeletal: Negative.   Skin: Negative.   Neurological: Negative.   Psychiatric/Behavioral: Negative.       Objective:    Physical Exam Vitals and nursing note reviewed.  Constitutional:      General: She is not in acute distress.    Appearance: Normal appearance. She is normal weight.  She is not ill-appearing, toxic-appearing or diaphoretic.  Cardiovascular:     Rate and Rhythm: Normal rate and regular rhythm.     Heart sounds: Normal heart sounds. No murmur heard. No friction rub. No gallop.   Pulmonary:     Effort: Pulmonary effort is normal. No respiratory distress.     Breath sounds: Normal breath sounds. No stridor. No wheezing, rhonchi or rales.  Chest:     Chest wall: No tenderness.  Skin:    General: Skin is warm and dry.  Neurological:     General: No focal deficit present.     Mental Status: She is alert and oriented to person, place, and time. Mental status is at baseline.  Psychiatric:        Mood and Affect: Mood normal.        Behavior: Behavior normal.        Thought Content: Thought content normal.        Judgment: Judgment normal.     BP 122/87   Pulse 84   Temp 98.2 F (36.8 C) (Temporal)   Resp 18   Ht 5\' 1"  (1.549 m)   Wt 209 lb (94.8 kg)   SpO2 100%   BMI 39.49 kg/m  Wt Readings from Last 3 Encounters:  12/30/19 209 lb (94.8 kg)  09/30/19 216 lb 12.8 oz (98.3 kg)  04/27/19 223 lb (101.2 kg)     Health Maintenance Due  Topic Date Due  . COVID-19 Vaccine (2 - Pfizer 2-dose series) 12/09/2019    There are no preventive care reminders to display for this patient.  Lab Results  Component Value Date   TSH 2.270 09/30/2019   Lab Results  Component Value Date   WBC 9.2 09/30/2019   HGB 13.9 09/30/2019   HCT 41.4 09/30/2019   MCV 88 09/30/2019   Lab Results  Component Value Date   NA 139 09/30/2019   K 4.5 09/30/2019   CO2 19 (L) 09/30/2019   GLUCOSE 109 (H) 09/30/2019   BUN 7 09/30/2019   CREATININE 0.78 09/30/2019   BILITOT 0.4 09/30/2019   ALKPHOS 86 09/30/2019   AST 11 09/30/2019   ALT 16 09/30/2019   PROT 6.9 09/30/2019   ALBUMIN 4.3 09/30/2019   CALCIUM 9.3 09/30/2019   Lab Results  Component Value Date   CHOL 151 09/30/2019   Lab Results  Component Value Date   HDL 34 (L) 09/30/2019   Lab Results   Component Value Date   LDLCALC 97 09/30/2019   Lab Results  Component Value Date   TRIG 107 09/30/2019   Lab Results  Component Value Date   CHOLHDL 4.4 09/30/2019   Lab Results  Component Value Date   HGBA1C 5.8 (A) 12/30/2019      Assessment & Plan:   Problem List Items Addressed This Visit   None   Visit Diagnoses    Routine general medical examination at a health care facility    -  Primary   Relevant Orders   POCT glycosylated hemoglobin (Hb A1C) (Completed)   Eczema, unspecified type       Relevant Medications   triamcinolone (KENALOG) 0.1 %      No orders of the defined types were placed in this encounter.   Follow-up: Return in about 6 months (around 06/29/2020) for t2dm check.   PLAN  Great control, a1c down to 5.8  Continue the good work  Engineer, maintenance (IT) in 6 mo  Refill triamcinolone  Patient encouraged to call clinic with any questions, comments, or concerns.  Janeece Agee, NP

## 2019-12-30 NOTE — Patient Instructions (Signed)
° ° ° °  If you have lab work done today you will be contacted with your lab results within the next 2 weeks.  If you have not heard from us then please contact us. The fastest way to get your results is to register for My Chart. ° ° °IF you received an x-ray today, you will receive an invoice from [facility]. Please contact [facility] at [phone] with questions or concerns regarding your invoice.  ° °IF you received labwork today, you will receive an invoice from [facility]. Please contact [facility] at [phone] with questions or concerns regarding your invoice.  ° °Our billing staff will not be able to assist you with questions regarding bills from these companies. ° °You will be contacted with the lab results as soon as they are available. The fastest way to get your results is to activate your My Chart account. Instructions are located on the last page of this paperwork. If you have not heard from us regarding the results in 2 weeks, please contact this office. °  ° ° ° °

## 2020-02-24 ENCOUNTER — Ambulatory Visit (HOSPITAL_COMMUNITY)
Admission: EM | Admit: 2020-02-24 | Discharge: 2020-02-24 | Disposition: A | Discharge status: Home / Self Care | Payer: Managed Care, Other (non HMO) | Attending: Medical Oncology | Admitting: Medical Oncology

## 2020-02-24 ENCOUNTER — Encounter (HOSPITAL_COMMUNITY): Payer: Self-pay | Admitting: Emergency Medicine

## 2020-02-24 ENCOUNTER — Other Ambulatory Visit: Payer: Self-pay

## 2020-02-24 DIAGNOSIS — H66006 Acute suppurative otitis media without spontaneous rupture of ear drum, recurrent, bilateral: Secondary | ICD-10-CM

## 2020-02-24 MED ORDER — AMOXICILLIN-POT CLAVULANATE 875-125 MG PO TABS: 1.0000 | ORAL_TABLET | Freq: Two times a day (BID) | ORAL | 0 refills | Status: DC

## 2020-02-24 MED ORDER — AMOXICILLIN-POT CLAVULANATE 875-125 MG PO TABS: 1.0000 | ORAL_TABLET | Freq: Two times a day (BID) | ORAL | 0 refills | Status: AC

## 2020-02-24 NOTE — ED Triage Notes (Signed)
Pt presents today with right ear pain x 1 week.

## 2020-02-24 NOTE — ED Provider Notes (Signed)
[facility]    CSN: [id number] Arrival date & time: 02/24/20  1849      History   Chief Complaint Chief Complaint  Patient presents with  . Otalgia    right    HPI Karen Jensen is a 35 y.o. female.   HPI   Otalgia: Pt reports otalgia of the right ear for the past week.  Symptoms have failed to improve.  She states that she has a history of ear infections and had multiple sets of tubes as a child.  She states that her last ear infection was quite a few years ago.  She denies any ear trauma, fevers, decreased hearing or other cold symptoms but has noticed mild sensation of feeling off balance which is a common sign when she has an ear infection.  No other neurogenic changes.  She has not tried anything for symptoms other than saline nasal spray.  Past Medical History:  Diagnosis Date  . Anxiety   . Arthritis   . BMI 45.0-49.9, adult (HCC)   . Chronic asthma   . Chronic pain of right ankle   . Chronic rhinitis   . Eczema   . Migraines   . Smoker   . Snoring     Patient Active Problem List   Diagnosis Date Noted  . Smoker 04/27/2019  . Anxiety 06/24/2018  . Chronic rhinitis 06/24/2018  . BMI 45.0-49.9, adult (HCC) 06/24/2018  . Migraine 01/24/2015  . Asthma, chronic 01/24/2015  . Carpal tunnel syndrome 01/24/2015    Past Surgical History:  Procedure Laterality Date  . TYMPANOSTOMY TUBE PLACEMENT Bilateral     OB History   No obstetric history on file.      Home Medications    Prior to Admission medications   Medication Sig Start Date End Date Taking? Authorizing Provider  Acetaminophen (TYLENOL ARTHRITIS PAIN PO) Take by mouth.    [provider]  albuterol (PROVENTIL) (2.5 MG/3ML) 0.083% nebulizer solution Take 3 mLs (2.5 mg total) by nebulization every 6 (six) hours as needed for wheezing or shortness of breath. 09/30/19   Janeece Agee, NP  albuterol (VENTOLIN HFA) 108 (90 Base) MCG/ACT inhaler INHALE 1-2 PUFFS INTO THE  LUNGS EVERY 6 (SIX) HOURS AS NEEDED FOR WHEEZING OR SHORTNESS OF BREATH. 10/23/19   Janeece Agee, NP  beclomethasone (QVAR) 80 MCG/ACT inhaler Inhale 1 puff into the lungs in the morning and at bedtime. 09/30/19   Janeece Agee, NP  gabapentin (NEURONTIN) 300 MG capsule Take 1 capsule (300 mg total) by mouth daily. 08/05/19   Janace Aris, NP  QVAR REDIHALER 80 MCG/ACT inhaler SMARTSIG:1 Puff(s) Via Inhaler Morning-Night 09/30/19   [provider]  triamcinolone (KENALOG) 0.1 % Apply 1 application topically 2 (two) times daily. If there's any way to cover a bottle or jar, that would be great - or if there's an alternative that allows greater disp amount that would be awesome - looking for med potency topical steroid - thank you 12/30/19   Janeece Agee, NP    Family History Family History  Problem Relation Age of Onset  . Healthy Mother   . Eczema Sister   . Lupus Maternal Grandmother   . Heart disease Paternal Grandmother     Social History Social History   Tobacco Use  . Smoking status: Current Every Day Smoker    Packs/day: 1.00    Types: Cigarettes  . Smokeless tobacco: Never Used  Vaping Use  . Vaping Use: Never used  Substance Use Topics  . Alcohol use: No    Alcohol/week: 0.0 standard drinks  . Drug use: Yes    Frequency: 7.0 times per week    Types: Marijuana     Allergies   Other   Review of Systems Review of Systems As stated in HPI above  Physical Exam Triage Vital Signs ED Triage Vitals  Enc Vitals Group     BP 02/24/20 1901 133/81     Pulse Rate 02/24/20 1901 92     Resp 02/24/20 1901 20     Temp 02/24/20 1901 98.7 F (37.1 C)     Temp Source 02/24/20 1901 Oral     SpO2 02/24/20 1901 99 %     Weight --      Height --      Head Circumference --      Peak Flow --      Pain Score 02/24/20 1903 8     Pain Loc --      Pain Edu? --      Excl. in GC? --    No data found.  Updated Vital Signs BP 133/81 (BP Location: Right Arm)    Pulse 92   Temp 98.7 F (37.1 C) (Oral)   Resp 20   LMP 02/20/2020   SpO2 99%   Physical Exam Vitals and nursing note reviewed.  HENT:     Head: Normocephalic.     Right Ear: Hearing, ear canal and external ear normal. No decreased hearing noted. No swelling or tenderness. A middle ear effusion is present. No mastoid tenderness. No PE tube. Tympanic membrane is injected, scarred, erythematous and bulging. Tympanic membrane is not perforated or retracted.     Left Ear: Hearing, ear canal and external ear normal. No decreased hearing noted. No swelling or tenderness. A middle ear effusion is present. No mastoid tenderness. No PE tube. Tympanic membrane is injected, scarred, erythematous and bulging. Tympanic membrane is not perforated or retracted.  Cardiovascular:     Rate and Rhythm: Normal rate and regular rhythm.     Heart sounds: Normal heart sounds.  Pulmonary:     Effort: Pulmonary effort is normal.     Breath sounds: Normal breath sounds.  Lymphadenopathy:     Cervical: No cervical adenopathy.      UC Treatments / Results  Labs (all labs ordered are listed, but only abnormal results are displayed) Labs Reviewed - No data to display  EKG   Radiology No results found.  Procedures Procedures (including critical care time)  Medications Ordered in UC Medications - No data to display  Initial Impression / Assessment and Plan / UC Course  I have reviewed the triage vital signs and the nursing notes.  Pertinent labs & imaging results that were available during my care of the patient were reviewed by me and considered in my medical decision making (see chart for details).     New.  Treating for acute otitis media with Augmentin.  Discussed how to take along with common potential side effects and precautions.  Should her symptoms worsen or fail to improve with treatment she will need to follow-up with her PCP.  She should continue her saline nasal drops. Final Clinical  Impressions(s) / UC Diagnoses   Final diagnoses:  None   Discharge Instructions   None    ED Prescriptions    None     PDMP not reviewed this encounter.   Rushie Chestnut, Cordelia Poche 02/24/20 2001

## 2020-03-01 ENCOUNTER — Telehealth: Payer: Self-pay | Admitting: Registered Nurse

## 2020-03-01 NOTE — Telephone Encounter (Signed)
Error

## 2020-03-02 ENCOUNTER — Ambulatory Visit (INDEPENDENT_AMBULATORY_CARE_PROVIDER_SITE_OTHER): Payer: Managed Care, Other (non HMO) | Admitting: Registered Nurse

## 2020-03-02 ENCOUNTER — Other Ambulatory Visit: Payer: Self-pay

## 2020-03-02 ENCOUNTER — Encounter: Payer: Self-pay | Admitting: Registered Nurse

## 2020-03-02 VITALS — BP 123/84 | HR 87 | Temp 98.0°F | Resp 18 | Ht 61.0 in | Wt 208.0 lb

## 2020-03-02 DIAGNOSIS — H6690 Otitis media, unspecified, unspecified ear: Secondary | ICD-10-CM

## 2020-03-02 DIAGNOSIS — B373 Candidiasis of vulva and vagina: Secondary | ICD-10-CM | POA: Diagnosis not present

## 2020-03-02 DIAGNOSIS — B3731 Acute candidiasis of vulva and vagina: Secondary | ICD-10-CM

## 2020-03-02 MED ORDER — FLUCONAZOLE 150 MG PO TABS: 150.0000 mg | ORAL_TABLET | Freq: Once | ORAL | 0 refills | Status: AC

## 2020-03-02 NOTE — Patient Instructions (Signed)
° ° ° °  If you have lab work done today you will be contacted with your lab results within the next 2 weeks.  If you have not heard from us then please contact us. The fastest way to get your results is to register for My Chart. ° ° °IF you received an x-ray today, you will receive an invoice from [facility]. Please contact [facility] at [phone] with questions or concerns regarding your invoice.  ° °IF you received labwork today, you will receive an invoice from [facility]. Please contact [facility] at [phone] with questions or concerns regarding your invoice.  ° °Our billing staff will not be able to assist you with questions regarding bills from these companies. ° °You will be contacted with the lab results as soon as they are available. The fastest way to get your results is to activate your My Chart account. Instructions are located on the last page of this paperwork. If you have not heard from us regarding the results in 2 weeks, please contact this office. °  ° ° ° °

## 2020-03-02 NOTE — Progress Notes (Signed)
Acute Office Visit  Subjective:    Patient ID: Karen Jensen, female    DOB: 1985-09-25, 35 y.o.   MRN: [id number]  Chief Complaint  Patient presents with  . Allergic Rhinitis   . Vaginitis    Patient states she was taking amoxicillin for an ear infection and now has an yeast infection. Per patient she has been itching and some white discharge and some pain.    HPI Patient is in today for vaginal discharge  Onset after starting abx for aom last week after being seen in urgent care Some itching and irritation, no odor, no pelvic pain, no flank pain, no urinary symptoms Denies systemic symptoms, sti exposure, and other concerns  States ears are feeling better despite stopping course of abx early. No ongoing symptoms  Past Medical History:  Diagnosis Date  . Anxiety   . Arthritis   . BMI 45.0-49.9, adult (HCC)   . Chronic asthma   . Chronic pain of right ankle   . Chronic rhinitis   . Eczema   . Migraines   . Smoker   . Snoring     Past Surgical History:  Procedure Laterality Date  . TYMPANOSTOMY TUBE PLACEMENT Bilateral     Family History  Problem Relation Age of Onset  . Healthy Mother   . Eczema Sister   . Lupus Maternal Grandmother   . Heart disease Paternal Grandmother     Social History   Socioeconomic History  . Marital status: Single    Spouse name: n/a  . Number of children: 0  . Years of education: 12+  . Highest education level: Not on file  Occupational History  . Occupation: Set designer  Tobacco Use  . Smoking status: Current Every Day Smoker    Packs/day: 1.00    Types: Cigarettes  . Smokeless tobacco: Never Used  Vaping Use  . Vaping Use: Never used  Substance and Sexual Activity  . Alcohol use: No    Alcohol/week: 0.0 standard drinks  . Drug use: Yes    Frequency: 7.0 times per week    Types: Marijuana  . Sexual activity: Yes    Partners: Female  Other Topics Concern  . Not on file  Social History Narrative    Completing her second year at [employer] and plans to transfer to [employer].   Lives alone.   Family lives close by.       Social Determinants of Health   Financial Resource Strain: Not on file  Food Insecurity: Not on file  Transportation Needs: Not on file  Physical Activity: Not on file  Stress: Not on file  Social Connections: Not on file  Intimate Partner Violence: Not on file    Outpatient Medications Prior to Visit  Medication Sig Dispense Refill  . Acetaminophen (TYLENOL ARTHRITIS PAIN PO) Take by mouth.    Marland Kitchen albuterol (PROVENTIL) (2.5 MG/3ML) 0.083% nebulizer solution Take 3 mLs (2.5 mg total) by nebulization every 6 (six) hours as needed for wheezing or shortness of breath. 150 mL 1  . albuterol (VENTOLIN HFA) 108 (90 Base) MCG/ACT inhaler INHALE 1-2 PUFFS INTO THE LUNGS EVERY 6 (SIX) HOURS AS NEEDED FOR WHEEZING OR SHORTNESS OF BREATH. 18 each 0  . beclomethasone (QVAR) 80 MCG/ACT inhaler Inhale 1 puff into the lungs in the morning and at bedtime. 1 each 3  . gabapentin (NEURONTIN) 300 MG capsule Take 1 capsule (300 mg total) by mouth daily. 30 capsule 1  . QVAR REDIHALER 80 MCG/ACT inhaler  SMARTSIG:1 Puff(s) Via Inhaler Morning-Night    . triamcinolone (KENALOG) 0.1 % Apply 1 application topically 2 (two) times daily. If there's any way to cover a bottle or jar, that would be great - or if there's an alternative that allows greater disp amount that would be awesome - looking for med potency topical steroid - thank you 320 g 2  . amoxicillin-clavulanate (AUGMENTIN) 875-125 MG tablet Take 1 tablet by mouth every 12 (twelve) hours for 10 days. (Patient not taking: Reported on 03/02/2020) 20 tablet 0   No facility-administered medications prior to visit.    Allergies  Allergen Reactions  . Other     WOOL produces rash    Review of Systems  Constitutional: Negative.   HENT: Negative.   Eyes: Negative.   Respiratory: Negative.   Cardiovascular: Negative.   Gastrointestinal:  Negative.   Genitourinary: Positive for vaginal discharge and vaginal pain.  Musculoskeletal: Negative.   Skin: Negative.   Neurological: Negative.   Psychiatric/Behavioral: Negative.   All other systems reviewed and are negative.      Objective:    Physical Exam Vitals and nursing note reviewed.  Constitutional:      General: She is not in acute distress.    Appearance: Normal appearance. She is not ill-appearing, toxic-appearing or diaphoretic.  HENT:     Right Ear: Tympanic membrane, ear canal and external ear normal. There is no impacted cerumen.     Left Ear: Tympanic membrane, ear canal and external ear normal. There is no impacted cerumen.  Cardiovascular:     Rate and Rhythm: Normal rate and regular rhythm.     Pulses: Normal pulses.     Heart sounds: Normal heart sounds. No murmur heard. No friction rub. No gallop.   Pulmonary:     Effort: Pulmonary effort is normal. No respiratory distress.     Breath sounds: Normal breath sounds. No stridor. No wheezing, rhonchi or rales.  Chest:     Chest wall: No tenderness.  Skin:    General: Skin is warm and dry.     Capillary Refill: Capillary refill takes less than 2 seconds.  Neurological:     General: No focal deficit present.     Mental Status: She is alert and oriented to person, place, and time. Mental status is at baseline.  Psychiatric:        Mood and Affect: Mood normal.        Behavior: Behavior normal.        Thought Content: Thought content normal.        Judgment: Judgment normal.     BP 123/84   Pulse 87   Temp 98 F (36.7 C) (Temporal)   Resp 18   Ht 5\' 1"  (1.549 m)   Wt 208 lb (94.3 kg)   LMP 02/20/2020   SpO2 100%   BMI 39.30 kg/m  Wt Readings from Last 3 Encounters:  03/02/20 208 lb (94.3 kg)  12/30/19 209 lb (94.8 kg)  09/30/19 216 lb 12.8 oz (98.3 kg)    There are no preventive care reminders to display for this patient.  There are no preventive care reminders to display for this  patient.   Lab Results  Component Value Date   TSH 2.270 09/30/2019   Lab Results  Component Value Date   WBC 9.2 09/30/2019   HGB 13.9 09/30/2019   HCT 41.4 09/30/2019   MCV 88 09/30/2019   Lab Results  Component Value Date   NA 139  09/30/2019   K 4.5 09/30/2019   CO2 19 (L) 09/30/2019   GLUCOSE 109 (H) 09/30/2019   BUN 7 09/30/2019   CREATININE 0.78 09/30/2019   BILITOT 0.4 09/30/2019   ALKPHOS 86 09/30/2019   AST 11 09/30/2019   ALT 16 09/30/2019   PROT 6.9 09/30/2019   ALBUMIN 4.3 09/30/2019   CALCIUM 9.3 09/30/2019   Lab Results  Component Value Date   CHOL 151 09/30/2019   Lab Results  Component Value Date   HDL 34 (L) 09/30/2019   Lab Results  Component Value Date   LDLCALC 97 09/30/2019   Lab Results  Component Value Date   TRIG 107 09/30/2019   Lab Results  Component Value Date   CHOLHDL 4.4 09/30/2019   Lab Results  Component Value Date   HGBA1C 5.8 (A) 12/30/2019       Assessment & Plan:   Problem List Items Addressed This Visit   None   Visit Diagnoses    Vaginal candida    -  Primary   Relevant Medications   fluconazole (DIFLUCAN) 150 MG tablet   Acute otitis media, unspecified otitis media type       Relevant Medications   fluconazole (DIFLUCAN) 150 MG tablet       Meds ordered this encounter  Medications  . fluconazole (DIFLUCAN) 150 MG tablet    Sig: Take 1 tablet (150 mg total) by mouth once for 1 dose.    Dispense:  1 tablet    Refill:  0    Order Specific Question:   Supervising Provider    Answer:   Neva Seat, JEFFREY R [[id number]]   PLAN  Self swab to be sent out  Given recent abx use, feel good about sending diflucan prior to labs resulting  Encourage pt to ask for diflucan rx when on abx if vaginal candidal infx common with abx use.   Discussed abx use - always wise to finish course even if improving  Return prn  Patient encouraged to call clinic with any questions, comments, or concerns.   Janeece Agee,  NP

## 2020-03-02 NOTE — Addendum Note (Signed)
Addended by: Rudi Heap on: 03/02/2020 08:56 AM   Modules accepted: Orders

## 2020-03-03 LAB — WET PREP FOR TRICH, YEAST, CLUE
Clue Cell Exam: NEGATIVE
Trichomonas Exam: NEGATIVE
Yeast Exam: NEGATIVE

## 2020-04-12 ENCOUNTER — Emergency Department (HOSPITAL_COMMUNITY)
Admission: EM | Admit: 2020-04-12 | Discharge: 2020-04-12 | Disposition: A | Discharge status: Home / Self Care | Payer: Managed Care, Other (non HMO) | Attending: Emergency Medicine | Admitting: Emergency Medicine

## 2020-04-12 ENCOUNTER — Emergency Department (HOSPITAL_COMMUNITY): Payer: Managed Care, Other (non HMO)

## 2020-04-12 ENCOUNTER — Encounter (HOSPITAL_COMMUNITY): Payer: Self-pay

## 2020-04-12 DIAGNOSIS — M79671 Pain in right foot: Secondary | ICD-10-CM | POA: Insufficient documentation

## 2020-04-12 DIAGNOSIS — Z7951 Long term (current) use of inhaled steroids: Secondary | ICD-10-CM | POA: Insufficient documentation

## 2020-04-12 DIAGNOSIS — F17210 Nicotine dependence, cigarettes, uncomplicated: Secondary | ICD-10-CM | POA: Diagnosis not present

## 2020-04-12 DIAGNOSIS — J45909 Unspecified asthma, uncomplicated: Secondary | ICD-10-CM | POA: Insufficient documentation

## 2020-04-12 DIAGNOSIS — M25571 Pain in right ankle and joints of right foot: Secondary | ICD-10-CM | POA: Diagnosis not present

## 2020-04-12 MED ORDER — PREDNISONE 20 MG PO TABS: 40.0000 mg | ORAL_TABLET | Freq: Every day | ORAL | 0 refills | Status: AC

## 2020-04-12 MED ORDER — KETOROLAC TROMETHAMINE 15 MG/ML IJ SOLN
15.0000 mg | Freq: Once | INTRAMUSCULAR | Status: AC
  Administered 2020-04-12: 15 mg via INTRAMUSCULAR
  Filled 2020-04-12: qty 1

## 2020-04-12 NOTE — Discharge Instructions (Signed)
Take prednisone as prescribed.  Do not take other anti-inflammatories at the same time (Advil, Motrin, naproxen, Aleve). You may supplement with Tylenol if you need further pain control. Use ice packs or a frozen water bottle 3 times a day.  Do gentle stretches, especially when first standing up.  Use orthotics/support in your shoes/boots to help with pain.  Follow up with the foot doctor listed below as needed if pain is not improving.  Return to the ER if you have color change of your foot, persistent numbness, fevers, or any new, worsening, or concerning symptoms.

## 2020-04-12 NOTE — ED Triage Notes (Signed)
Pt c/o R ankle pain x 10 days, states it has gotten worse over the last 3 days to where she is unable to bear weight. Denies any injury
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
## 2020-06-22 ENCOUNTER — Ambulatory Visit: Payer: Self-pay | Admitting: Registered Nurse

## 2020-10-23 ENCOUNTER — Ambulatory Visit
Admission: EM | Admit: 2020-10-23 | Discharge: 2020-10-23 | Disposition: A | Discharge status: Home / Self Care | Payer: Managed Care, Other (non HMO) | Attending: Internal Medicine | Admitting: Internal Medicine

## 2020-10-23 ENCOUNTER — Encounter: Payer: Self-pay | Admitting: Emergency Medicine

## 2020-10-23 ENCOUNTER — Other Ambulatory Visit: Payer: Self-pay

## 2020-10-23 DIAGNOSIS — J45909 Unspecified asthma, uncomplicated: Secondary | ICD-10-CM | POA: Diagnosis not present

## 2020-10-23 DIAGNOSIS — G5601 Carpal tunnel syndrome, right upper limb: Secondary | ICD-10-CM | POA: Diagnosis not present

## 2020-10-23 MED ORDER — GABAPENTIN 300 MG PO CAPS: 300.0000 mg | ORAL_CAPSULE | Freq: Every day | ORAL | 1 refills | Status: DC

## 2020-10-23 MED ORDER — ALBUTEROL SULFATE HFA 108 (90 BASE) MCG/ACT IN AERS: 1.0000 | INHALATION_SPRAY | Freq: Four times a day (QID) | RESPIRATORY_TRACT | 1 refills | Status: DC | PRN

## 2020-10-23 MED ORDER — BECLOMETHASONE DIPROPIONATE 80 MCG/ACT IN AERS: 1.0000 | INHALATION_SPRAY | Freq: Two times a day (BID) | RESPIRATORY_TRACT | 1 refills | Status: DC

## 2020-10-23 MED ORDER — ALBUTEROL SULFATE (2.5 MG/3ML) 0.083% IN NEBU: 2.5000 mg | INHALATION_SOLUTION | Freq: Four times a day (QID) | RESPIRATORY_TRACT | 1 refills | Status: DC | PRN

## 2020-10-23 NOTE — ED Provider Notes (Signed)
[facility]    CSN: [id number] Arrival date & time: 10/23/20  9417      History   Chief Complaint Chief Complaint  Patient presents with   Medication Refill    HPI Karen Jensen is a 35 y.o. female.   Patient presents for medication refill for albuterol inhaler, albuterol nebulizer, Qvar inhaler, and gabapentin.  Patient reports that she has been taking these medications for multiple years.  She has been out of these medications for several months as she was unable to afford the medications and her PCP moved recently, and she was not able to find them.  PCP was last seen in March.  Last blood work was also completed in March.  She has asthma that is pretty well controlled per patient, although she is having shortness of breath that only occurs with exertion since being out of her albuterol inhaler.  Denies shortness of breath at rest or chest pain.  Patient reports that she takes gabapentin for carpal tunnel syndrome.  Is followed by orthopedist and saw orthopedist approximately 1 year ago.  There are plans to have carpal tunnel surgery, although patient has not yet set the surgery up.  Patient reports that she tolerates all of her medications well.  Patient reports that the Qvar inhaler has been beneficial for her at times in the past.  She is requesting a refill on the Qvar inhaler at previous dose to see if it will be beneficial for her currently.   Medication Refill  Past Medical History:  Diagnosis Date   Anxiety    Arthritis    BMI 45.0-49.9, adult (HCC)    Chronic asthma    Chronic pain of right ankle    Chronic rhinitis    Eczema    Migraines    Smoker    Snoring     Patient Active Problem List   Diagnosis Date Noted   Smoker 04/27/2019   Anxiety 06/24/2018   Chronic rhinitis 06/24/2018   BMI 45.0-49.9, adult (HCC) 06/24/2018   Migraine 01/24/2015   Asthma, chronic 01/24/2015   Carpal tunnel syndrome 01/24/2015    Past Surgical History:   Procedure Laterality Date   TYMPANOSTOMY TUBE PLACEMENT Bilateral     OB History   No obstetric history on file.      Home Medications    Prior to Admission medications   Medication Sig Start Date End Date Taking? Authorizing Provider  Acetaminophen (TYLENOL ARTHRITIS PAIN PO) Take by mouth.    [provider]  albuterol (PROVENTIL) (2.5 MG/3ML) 0.083% nebulizer solution Take 3 mLs (2.5 mg total) by nebulization every 6 (six) hours as needed for wheezing or shortness of breath. 10/23/20   Lance Muss, FNP  albuterol (VENTOLIN HFA) 108 (90 Base) MCG/ACT inhaler Inhale 1-2 puffs into the lungs every 6 (six) hours as needed for wheezing or shortness of breath. 10/23/20   Lance Muss, FNP  beclomethasone (QVAR) 80 MCG/ACT inhaler Inhale 1 puff into the lungs in the morning and at bedtime. 10/23/20   Lance Muss, FNP  gabapentin (NEURONTIN) 300 MG capsule Take 1 capsule (300 mg total) by mouth daily. 10/23/20   Lance Muss, FNP  QVAR REDIHALER 80 MCG/ACT inhaler SMARTSIG:1 Puff(s) Via Inhaler Morning-Night 09/30/19   [provider]  triamcinolone (KENALOG) 0.1 % Apply 1 application topically 2 (two) times daily. If there's any way to cover a bottle or jar, that would be great - or if there's an alternative that  allows greater disp amount that would be awesome - looking for med potency topical steroid - thank you 12/30/19   Janeece Agee, NP    Family History Family History  Problem Relation Age of Onset   Healthy Mother    Eczema Sister    Lupus Maternal Grandmother    Heart disease Paternal Grandmother     Social History Social History   Tobacco Use   Smoking status: Every Day    Packs/day: 1.00    Types: Cigarettes   Smokeless tobacco: Never  Vaping Use   Vaping Use: Never used  Substance Use Topics   Alcohol use: No    Alcohol/week: 0.0 standard drinks   Drug use: Yes    Frequency: 7.0 times per week    Types: Marijuana     Allergies    Other   Review of Systems Review of Systems Per HPI  Physical Exam Triage Vital Signs ED Triage Vitals  Enc Vitals Group     BP 10/23/20 0952 119/82     Pulse Rate 10/23/20 0952 84     Resp 10/23/20 0952 16     Temp 10/23/20 0952 98.2 F (36.8 C)     Temp Source 10/23/20 0952 Oral     SpO2 10/23/20 0952 98 %     Weight --      Height --      Head Circumference --      Peak Flow --      Pain Score 10/23/20 0951 0     Pain Loc --      Pain Edu? --      Excl. in GC? --    No data found.  Updated Vital Signs BP 119/82 (BP Location: Left Arm)   Pulse 84   Temp 98.2 F (36.8 C) (Oral)   Resp 16   SpO2 98%   Visual Acuity Right Eye Distance:   Left Eye Distance:   Bilateral Distance:    Right Eye Near:   Left Eye Near:    Bilateral Near:     Physical Exam Constitutional:      General: She is not in acute distress.    Appearance: Normal appearance. She is not toxic-appearing or diaphoretic.  HENT:     Head: Normocephalic and atraumatic.  Eyes:     Extraocular Movements: Extraocular movements intact.     Conjunctiva/sclera: Conjunctivae normal.     Pupils: Pupils are equal, round, and reactive to light.  Cardiovascular:     Rate and Rhythm: Normal rate and regular rhythm.     Pulses: Normal pulses.     Heart sounds: Normal heart sounds.  Pulmonary:     Effort: Pulmonary effort is normal. No respiratory distress.     Breath sounds: Normal breath sounds.  Skin:    General: Skin is warm and dry.  Neurological:     General: No focal deficit present.     Mental Status: She is alert and oriented to person, place, and time. Mental status is at baseline.  Psychiatric:        Mood and Affect: Mood normal.        Behavior: Behavior normal.        Thought Content: Thought content normal.        Judgment: Judgment normal.     UC Treatments / Results  Labs (all labs ordered are listed, but only abnormal results are displayed) Labs Reviewed - No data to  display  EKG   Radiology  No results found.  Procedures Procedures (including critical care time)  Medications Ordered in UC Medications - No data to display  Initial Impression / Assessment and Plan / UC Course  I have reviewed the triage vital signs and the nursing notes.  Pertinent labs & imaging results that were available during my care of the patient were reviewed by me and considered in my medical decision making (see chart for details).     Will refill patient's daily medications as previously prescribed.  Patient reports tolerating medications well, so do think this is reasonable.  Advised patient that she will need to follow-up with PCP as soon as possible.  Patient was able to find where PCP is currently located during visit to urgent care today.  No red flags on exam. Discussed strict return precautions. Patient verbalized understanding and is agreeable with plan.  Final Clinical Impressions(s) / UC Diagnoses   Final diagnoses:  Asthma, chronic, unspecified asthma severity, uncomplicated  Carpal tunnel syndrome of right wrist     Discharge Instructions      Your medications have been refilled.  Please keep in mind that albuterol inhaler and albuterol nebulizer solution are the same medication.  Please use at least 6 hours apart.  Please follow-up with primary care physician as soon as possible for further evaluation and management.     ED Prescriptions     Medication Sig Dispense Auth. Provider   albuterol (PROVENTIL) (2.5 MG/3ML) 0.083% nebulizer solution Take 3 mLs (2.5 mg total) by nebulization every 6 (six) hours as needed for wheezing or shortness of breath. 150 mL Lance Muss, FNP   albuterol (VENTOLIN HFA) 108 (90 Base) MCG/ACT inhaler Inhale 1-2 puffs into the lungs every 6 (six) hours as needed for wheezing or shortness of breath. 1 each Lance Muss, FNP   beclomethasone (QVAR) 80 MCG/ACT inhaler Inhale 1 puff into the lungs in the morning and at  bedtime. 1 each Lance Muss, FNP   gabapentin (NEURONTIN) 300 MG capsule Take 1 capsule (300 mg total) by mouth daily. 30 capsule Lance Muss, FNP      PDMP not reviewed this encounter.   Lance Muss, FNP 10/23/20 [phone]

## 2020-10-23 NOTE — Discharge Instructions (Signed)
Your medications have been refilled.  Please keep in mind that albuterol inhaler and albuterol nebulizer solution are the same medication.  Please use at least 6 hours apart.  Please follow-up with primary care physician as soon as possible for further evaluation and management.

## 2020-10-23 NOTE — ED Triage Notes (Signed)
PCP moved locations. Assisted patient with locating PCP. Requesting refills of albuterol and nebulizing medications. Also requesting refills of gabapentin.

## 2020-10-24 ENCOUNTER — Ambulatory Visit (INDEPENDENT_AMBULATORY_CARE_PROVIDER_SITE_OTHER): Payer: Managed Care, Other (non HMO) | Admitting: Registered Nurse

## 2020-10-24 ENCOUNTER — Encounter: Payer: Self-pay | Admitting: Registered Nurse

## 2020-10-24 ENCOUNTER — Other Ambulatory Visit: Payer: Self-pay

## 2020-10-24 VITALS — BP 119/85 | HR 86 | Temp 98.1°F | Resp 18 | Ht 60.0 in | Wt 225.0 lb

## 2020-10-24 DIAGNOSIS — Z8639 Personal history of other endocrine, nutritional and metabolic disease: Secondary | ICD-10-CM | POA: Diagnosis not present

## 2020-10-24 DIAGNOSIS — Z6841 Body Mass Index (BMI) 40.0 and over, adult: Secondary | ICD-10-CM | POA: Diagnosis not present

## 2020-10-24 DIAGNOSIS — Z13220 Encounter for screening for lipoid disorders: Secondary | ICD-10-CM | POA: Diagnosis not present

## 2020-10-24 LAB — HEMOGLOBIN A1C: Hgb A1c MFr Bld: 6.3 % (ref 4.6–6.5)

## 2020-10-24 LAB — COMPREHENSIVE METABOLIC PANEL
ALT: 17 U/L (ref 0–35)
AST: 12 U/L (ref 0–37)
Albumin: 4.1 g/dL (ref 3.5–5.2)
Alkaline Phosphatase: 60 U/L (ref 39–117)
BUN: 11 mg/dL (ref 6–23)
CO2: 25 mEq/L (ref 19–32)
Calcium: 9.5 mg/dL (ref 8.4–10.5)
Chloride: 105 mEq/L (ref 96–112)
Creatinine, Ser: 0.71 mg/dL (ref 0.40–1.20)
GFR: 111.66 mL/min (ref >60.00)
Glucose, Bld: 100 mg/dL — ABNORMAL HIGH (ref 70–99)
Potassium: 4.3 mEq/L (ref 3.5–5.1)
Sodium: 136 mEq/L (ref 135–145)
Total Bilirubin: 0.6 mg/dL (ref 0.2–1.2)
Total Protein: 6.7 g/dL (ref 6.0–8.3)

## 2020-10-24 LAB — LIPID PANEL
Cholesterol: 150 mg/dL (ref 0–200)
HDL: 38.3 mg/dL — ABNORMAL LOW (ref >39.00)
LDL Cholesterol: 75 mg/dL (ref 0–99)
NonHDL: 111.55
Total CHOL/HDL Ratio: 4
Triglycerides: 185 mg/dL — ABNORMAL HIGH (ref 0.0–149.0)
VLDL: 37 mg/dL (ref 0.0–40.0)

## 2020-10-24 LAB — CBC WITH DIFFERENTIAL/PLATELET
Basophils Absolute: 0.1 10*3/uL (ref 0.0–0.1)
Basophils Relative: 1.1 % (ref 0.0–3.0)
Eosinophils Absolute: 0.3 10*3/uL (ref 0.0–0.7)
Eosinophils Relative: 2.4 % (ref 0.0–5.0)
HCT: 39.7 % (ref 36.0–46.0)
Hemoglobin: 13.4 g/dL (ref 12.0–15.0)
Lymphocytes Relative: 25.7 % (ref 12.0–46.0)
Lymphs Abs: 2.7 10*3/uL (ref 0.7–4.0)
MCHC: 33.7 g/dL (ref 30.0–36.0)
MCV: 89.3 fl (ref 78.0–100.0)
Monocytes Absolute: 0.7 10*3/uL (ref 0.1–1.0)
Monocytes Relative: 6.9 % (ref 3.0–12.0)
Neutro Abs: 6.7 10*3/uL (ref 1.4–7.7)
Neutrophils Relative %: 63.9 % (ref 43.0–77.0)
Platelets: 309 10*3/uL (ref 150.0–400.0)
RBC: 4.44 Mil/uL (ref 3.87–5.11)
RDW: 13.2 % (ref 11.5–15.5)
WBC: 10.4 10*3/uL (ref 4.0–10.5)

## 2020-10-24 LAB — TSH: TSH: 2.15 u[IU]/mL (ref 0.35–5.50)

## 2020-10-24 MED ORDER — PHENTERMINE HCL 30 MG PO CAPS
30.0000 mg | ORAL_CAPSULE | ORAL | 0 refills | Status: DC
Start: 2020-10-24 — End: 2020-10-29

## 2020-10-24 NOTE — Patient Instructions (Addendum)
Ms. Karen Jensen to see you! Glad you found me.  Diet -  Eat real food - limit processed foods and drinks Not too much - control portion sizes. Mostly plants - self explanatory!  Exercise: 30 minutes each day Walking, jogging, easy lifting Recommend Yoga for flexibility - check out Yoga With Adriene on YouTube.  See attached for some specifics  See you in 3 mo  Thanks,  Rich     If you have lab work done today you will be contacted with your lab results within the next 2 weeks.  If you have not heard from [place] then please contact us. The fastest way to get your results is to register for My Chart.   IF you received an x-ray today, you will receive an invoice from [facility]. Please contact [facility] at [phone] with questions or concerns regarding your invoice.   IF you received labwork today, you will receive an invoice from [place]. Please contact [facility] at [phone] with questions or concerns regarding your invoice.   Our billing staff will not be able to assist you with questions regarding bills from these companies.  You will be contacted with the lab results as soon as they are available. The fastest way to get your results is to activate your My Chart account. Instructions are located on the last page of this paperwork. If you have not heard from [place] regarding the results in 2 weeks, please contact this office.

## 2020-10-24 NOTE — Progress Notes (Signed)
Established Patient Office Visit  Subjective:  Patient ID: Karen Jensen, female    DOB: Nov 18, 1985  Age: 35 y.o. MRN: [id number]  CC:  Chief Complaint  Patient presents with   Follow-up    Patient states she is here for 6 month follow up and discuss weight loss    HPI Karen Jensen presents for follow up, weight  T2dm: Lab Results  Component Value Date   HGBA1C 5.8 (A) 12/30/2019  Not currently on medication  Asthma On albuterol inhaler, neb. On qvar 1 puff twice daily.  Good effect, no AE.   Wrist pain Taking gabapentin. Good effect.  Hopes to continue Wants to avoid surgeyr  Weight Filed Weights   10/24/20 0919  Weight: 225 lb (102.1 kg)  Wants to explore options for weight loss.  Has not been on weight loss medication in past.    Past Medical History:  Diagnosis Date   Anxiety    Arthritis    BMI 45.0-49.9, adult (HCC)    Chronic asthma    Chronic pain of right ankle    Chronic rhinitis    Eczema    Migraines    Smoker    Snoring     Past Surgical History:  Procedure Laterality Date   TYMPANOSTOMY TUBE PLACEMENT Bilateral     Family History  Problem Relation Age of Onset   Healthy Mother    Eczema Sister    Lupus Maternal Grandmother    Heart disease Paternal Grandmother     Social History   Socioeconomic History   Marital status: Single    Spouse name: n/a   Number of children: 0   Years of education: 12+   Highest education level: Not on file  Occupational History   Occupation: Set designer  Tobacco Use   Smoking status: Every Day    Packs/day: 1.00    Types: Cigarettes   Smokeless tobacco: Never  Vaping Use   Vaping Use: Never used  Substance and Sexual Activity   Alcohol use: No    Alcohol/week: 0.0 standard drinks   Drug use: Yes    Frequency: 7.0 times per week    Types: Marijuana   Sexual activity: Yes    Partners: Female  Other Topics Concern   Not on file  Social History Narrative   Completing  her second year at [employer] and plans to transfer to [employer].   Lives alone.   Family lives close by.       Social Determinants of Health   Financial Resource Strain: Not on file  Food Insecurity: Not on file  Transportation Needs: Not on file  Physical Activity: Not on file  Stress: Not on file  Social Connections: Not on file  Intimate Partner Violence: Not on file    Outpatient Medications Prior to Visit  Medication Sig Dispense Refill   Acetaminophen (TYLENOL ARTHRITIS PAIN PO) Take by mouth.     albuterol (PROVENTIL) (2.5 MG/3ML) 0.083% nebulizer solution Take 3 mLs (2.5 mg total) by nebulization every 6 (six) hours as needed for wheezing or shortness of breath. 150 mL 1   albuterol (VENTOLIN HFA) 108 (90 Base) MCG/ACT inhaler Inhale 1-2 puffs into the lungs every 6 (six) hours as needed for wheezing or shortness of breath. 1 each 1   beclomethasone (QVAR) 80 MCG/ACT inhaler Inhale 1 puff into the lungs in the morning and at bedtime. 1 each 1   gabapentin (NEURONTIN) 300 MG capsule Take 1 capsule (300 mg total) by  mouth daily. 30 capsule 1   QVAR REDIHALER 80 MCG/ACT inhaler SMARTSIG:1 Puff(s) Via Inhaler Morning-Night     triamcinolone (KENALOG) 0.1 % Apply 1 application topically 2 (two) times daily. If there's any way to cover a bottle or jar, that would be great - or if there's an alternative that allows greater disp amount that would be awesome - looking for med potency topical steroid - thank you 320 g 2   No facility-administered medications prior to visit.    Allergies  Allergen Reactions   Other     WOOL produces rash    ROS Review of Systems  Constitutional: Negative.   HENT: Negative.    Eyes: Negative.   Respiratory: Negative.    Cardiovascular: Negative.   Gastrointestinal: Negative.   Genitourinary: Negative.   Musculoskeletal: Negative.   Skin: Negative.   Neurological: Negative.   Psychiatric/Behavioral: Negative.    All other systems reviewed and are  negative.    Objective:    Physical Exam Vitals and nursing note reviewed.  Constitutional:      General: She is not in acute distress.    Appearance: Normal appearance. She is normal weight. She is not ill-appearing, toxic-appearing or diaphoretic.  Cardiovascular:     Rate and Rhythm: Normal rate and regular rhythm.     Heart sounds: Normal heart sounds. No murmur heard.   No friction rub. No gallop.  Pulmonary:     Effort: Pulmonary effort is normal. No respiratory distress.     Breath sounds: Normal breath sounds. No stridor. No wheezing, rhonchi or rales.  Chest:     Chest wall: No tenderness.  Skin:    General: Skin is warm and dry.  Neurological:     General: No focal deficit present.     Mental Status: She is alert and oriented to person, place, and time. Mental status is at baseline.  Psychiatric:        Mood and Affect: Mood normal.        Behavior: Behavior normal.        Thought Content: Thought content normal.        Judgment: Judgment normal.    BP 119/85   Pulse 86   Temp 98.1 F (36.7 C) (Temporal)   Resp 18   Ht 5' (1.524 m)   Wt 225 lb (102.1 kg)   SpO2 99%   BMI 43.94 kg/m  Wt Readings from Last 3 Encounters:  10/24/20 225 lb (102.1 kg)  04/12/20 210 lb (95.3 kg)  03/02/20 208 lb (94.3 kg)     There are no preventive care reminders to display for this patient.  There are no preventive care reminders to display for this patient.  Lab Results  Component Value Date   TSH 2.270 09/30/2019   Lab Results  Component Value Date   WBC 9.2 09/30/2019   HGB 13.9 09/30/2019   HCT 41.4 09/30/2019   MCV 88 09/30/2019   Lab Results  Component Value Date   NA 139 09/30/2019   K 4.5 09/30/2019   CO2 19 (L) 09/30/2019   GLUCOSE 109 (H) 09/30/2019   BUN 7 09/30/2019   CREATININE 0.78 09/30/2019   BILITOT 0.4 09/30/2019   ALKPHOS 86 09/30/2019   AST 11 09/30/2019   ALT 16 09/30/2019   PROT 6.9 09/30/2019   ALBUMIN 4.3 09/30/2019   CALCIUM  9.3 09/30/2019   Lab Results  Component Value Date   CHOL 151 09/30/2019   Lab Results  Component  Value Date   HDL 34 (L) 09/30/2019   Lab Results  Component Value Date   LDLCALC 97 09/30/2019   Lab Results  Component Value Date   TRIG 107 09/30/2019   Lab Results  Component Value Date   CHOLHDL 4.4 09/30/2019   Lab Results  Component Value Date   HGBA1C 5.8 (A) 12/30/2019      Assessment & Plan:   Problem List Items Addressed This Visit   None Visit Diagnoses     History of type 2 diabetes mellitus    -  Primary   Relevant Orders   Comprehensive metabolic panel   CBC with Differential/Platelet   Lipid panel   TSH   Hemoglobin A1c   Lipid screening       Relevant Orders   Comprehensive metabolic panel   CBC with Differential/Platelet   Lipid panel   TSH   Hemoglobin A1c   Morbid obesity with BMI of 40.0-44.9, adult (HCC)       Relevant Medications   phentermine 30 MG capsule       Meds ordered this encounter  Medications   phentermine 30 MG capsule    Sig: Take 1 capsule (30 mg total) by mouth every morning.    Dispense:  30 capsule    Refill:  0    Order Specific Question:   Supervising Provider    Answer:   Neva Seat, JEFFREY R [[id number]]    Follow-up: Return in about 3 months (around 01/24/2021) for med check - phentermine.   PLAN Labs collected. Will follow up with the patient as warranted. Discussed phentermine vs semaglutide. Pt opts for phentermine start 30mg  po qd. Discussed r/b/se Given counseling on diet and exercise for weight loss. Educational materials supplied. Return in 3 mo for med check and labs Patient encouraged to call clinic with any questions, comments, or concerns.  , NP

## 2020-10-25 ENCOUNTER — Telehealth: Payer: Self-pay | Admitting: Registered Nurse

## 2020-10-25 NOTE — Telephone Encounter (Signed)
Patients phenteramine was called into [facility] [address] - patient states that they do not make the 30 mg. Anymore - Please advise

## 2020-10-29 ENCOUNTER — Other Ambulatory Visit: Payer: Self-pay | Admitting: Registered Nurse

## 2020-10-29 ENCOUNTER — Ambulatory Visit
Admission: EM | Admit: 2020-10-29 | Discharge: 2020-10-29 | Disposition: A | Discharge status: Home / Self Care | Payer: Managed Care, Other (non HMO) | Attending: Physician Assistant | Admitting: Physician Assistant

## 2020-10-29 ENCOUNTER — Encounter: Payer: Self-pay | Admitting: Emergency Medicine

## 2020-10-29 ENCOUNTER — Other Ambulatory Visit: Payer: Self-pay

## 2020-10-29 DIAGNOSIS — J069 Acute upper respiratory infection, unspecified: Secondary | ICD-10-CM | POA: Diagnosis not present

## 2020-10-29 MED ORDER — PHENTERMINE HCL 37.5 MG PO CAPS: 37.5000 mg | ORAL_CAPSULE | ORAL | 0 refills | Status: DC

## 2020-10-29 NOTE — Telephone Encounter (Signed)
Patient called back again today to check on her medication

## 2020-10-29 NOTE — ED Provider Notes (Signed)
[facility]    CSN: [id number] Arrival date & time: 10/29/20  1257      History   Chief Complaint Chief Complaint  Patient presents with   Facial Pain    HPI Karen Jensen is a 35 y.o. female.   Patient here today for evaluation of nasal congestion and drainage, headache, body aches, and cough that started about 3 days ago.  She denies any symptoms prior to this.  She has not had any nausea, vomiting or diarrhea.  She has tried over-the-counter treatment without significant relief.  She denies any known sick contacts  The history is provided by the patient.   Past Medical History:  Diagnosis Date   Anxiety    Arthritis    BMI 45.0-49.9, adult (HCC)    Chronic asthma    Chronic pain of right ankle    Chronic rhinitis    Eczema    Migraines    Smoker    Snoring     Patient Active Problem List   Diagnosis Date Noted   Smoker 04/27/2019   Anxiety 06/24/2018   Chronic rhinitis 06/24/2018   BMI 45.0-49.9, adult (HCC) 06/24/2018   Migraine 01/24/2015   Asthma, chronic 01/24/2015   Carpal tunnel syndrome 01/24/2015    Past Surgical History:  Procedure Laterality Date   TYMPANOSTOMY TUBE PLACEMENT Bilateral     OB History   No obstetric history on file.      Home Medications    Prior to Admission medications   Medication Sig Start Date End Date Taking? Authorizing Provider  Acetaminophen (TYLENOL ARTHRITIS PAIN PO) Take by mouth.   Yes [provider]  albuterol (PROVENTIL) (2.5 MG/3ML) 0.083% nebulizer solution Take 3 mLs (2.5 mg total) by nebulization every 6 (six) hours as needed for wheezing or shortness of breath. 10/23/20  Yes Lance Muss, FNP  albuterol (VENTOLIN HFA) 108 (90 Base) MCG/ACT inhaler Inhale 1-2 puffs into the lungs every 6 (six) hours as needed for wheezing or shortness of breath. 10/23/20  Yes Lance Muss, FNP  beclomethasone (QVAR) 80 MCG/ACT inhaler Inhale 1 puff into the lungs in the morning and at  bedtime. 10/23/20  Yes Lance Muss, FNP  gabapentin (NEURONTIN) 300 MG capsule Take 1 capsule (300 mg total) by mouth daily. 10/23/20  Yes Lance Muss, FNP  phentermine 37.5 MG capsule Take 1 capsule (37.5 mg total) by mouth every morning. 10/29/20  Yes Janeece Agee, NP  QVAR REDIHALER 80 MCG/ACT inhaler SMARTSIG:1 Puff(s) Via Inhaler Morning-Night 09/30/19  Yes [provider]  triamcinolone (KENALOG) 0.1 % Apply 1 application topically 2 (two) times daily. If there's any way to cover a bottle or jar, that would be great - or if there's an alternative that allows greater disp amount that would be awesome - looking for med potency topical steroid - thank you 12/30/19  Yes Janeece Agee, NP    Family History Family History  Problem Relation Age of Onset   Healthy Mother    Eczema Sister    Lupus Maternal Grandmother    Heart disease Paternal Grandmother     Social History Social History   Tobacco Use   Smoking status: Every Day    Packs/day: 1.00    Types: Cigarettes   Smokeless tobacco: Never  Vaping Use   Vaping Use: Never used  Substance Use Topics   Alcohol use: No    Alcohol/week: 0.0 standard drinks   Drug use: Yes    Frequency:  7.0 times per week    Types: Marijuana     Allergies   Other   Review of Systems Review of Systems  Constitutional:  Negative for chills and fever.  HENT:  Positive for congestion, sinus pressure and sore throat. Negative for ear pain.   Eyes:  Negative for discharge and redness.  Respiratory:  Positive for cough. Negative for shortness of breath and wheezing.   Gastrointestinal:  Negative for abdominal pain, diarrhea, nausea and vomiting.    Physical Exam Triage Vital Signs ED Triage Vitals [10/29/20 1415]  Enc Vitals Group     BP (!) 136/92     Pulse Rate 92     Resp      Temp 98.2 F (36.8 C)     Temp Source Oral     SpO2 98 %     Weight 225 lb (102.1 kg)     Height 5' 0.25" (1.53 m)     Head  Circumference      Peak Flow      Pain Score 7     Pain Loc      Pain Edu?      Excl. in GC?    No data found.  Updated Vital Signs BP (!) 136/92 (BP Location: Right Arm)   Pulse 92   Temp 98.2 F (36.8 C) (Oral)   Ht 5' 0.25" (1.53 m)   Wt 225 lb (102.1 kg)   SpO2 98%   BMI 43.58 kg/m     Physical Exam Vitals and nursing note reviewed.  Constitutional:      General: She is not in acute distress.    Appearance: Normal appearance. She is not ill-appearing.  HENT:     Head: Normocephalic and atraumatic.     Right Ear: Tympanic membrane normal.     Left Ear: Tympanic membrane normal.     Nose: Congestion present.     Mouth/Throat:     Mouth: Mucous membranes are moist.     Pharynx: No oropharyngeal exudate or posterior oropharyngeal erythema.  Eyes:     Conjunctiva/sclera: Conjunctivae normal.  Cardiovascular:     Rate and Rhythm: Normal rate and regular rhythm.     Heart sounds: Normal heart sounds. No murmur heard. Pulmonary:     Effort: Pulmonary effort is normal. No respiratory distress.     Breath sounds: Wheezing (rare scattered wheeze) present. No rhonchi or rales.  Skin:    General: Skin is warm and dry.  Neurological:     Mental Status: She is alert.  Psychiatric:        Mood and Affect: Mood normal.        Thought Content: Thought content normal.     UC Treatments / Results  Labs (all labs ordered are listed, but only abnormal results are displayed) Labs Reviewed  COVID-19, FLU A+B NAA    EKG   Radiology No results found.  Procedures Procedures (including critical care time)  Medications Ordered in UC Medications - No data to display  Initial Impression / Assessment and Plan / UC Course  I have reviewed the triage vital signs and the nursing notes.  Pertinent labs & imaging results that were available during my care of the patient were reviewed by me and considered in my medical decision making (see chart for details).  Suspect likely  viral etiology of symptoms, and we screen for COVID and flu today.  Will await results further recommendation.  Recommended she use her albuterol as needed.  Follow up with any worsening or further concerns.  Final Clinical Impressions(s) / UC Diagnoses   Final diagnoses:  Acute upper respiratory infection   Discharge Instructions   None    ED Prescriptions   None    PDMP not reviewed this encounter.   Tomi Bamberger, PA-C 10/29/20 1523

## 2020-10-29 NOTE — Telephone Encounter (Signed)
Sent 37.5mg   Thanks,  Retail banker

## 2020-10-29 NOTE — ED Triage Notes (Signed)
Patient c/o sinus headache and pressure for a couple of days.  Slight cough and nasal drainage.  Patient did have to take an albuterol nebulizer.

## 2020-10-29 NOTE — Telephone Encounter (Signed)
Patient called in about her phentermine 30 mg and her pharmacy [facility] on [address] said that they do not make 30 mg anymore.

## 2020-10-30 LAB — COVID-19, FLU A+B NAA
Influenza A, NAA: NOT DETECTED
Influenza B, NAA: NOT DETECTED
SARS-CoV-2, NAA: NOT DETECTED

## 2020-12-08 ENCOUNTER — Encounter (HOSPITAL_COMMUNITY): Payer: Self-pay | Admitting: Emergency Medicine

## 2020-12-08 ENCOUNTER — Ambulatory Visit (HOSPITAL_COMMUNITY)
Admission: EM | Admit: 2020-12-08 | Discharge: 2020-12-08 | Disposition: A | Discharge status: Home / Self Care | Payer: Managed Care, Other (non HMO) | Attending: Physician Assistant | Admitting: Physician Assistant

## 2020-12-08 ENCOUNTER — Other Ambulatory Visit: Payer: Self-pay

## 2020-12-08 DIAGNOSIS — M722 Plantar fascial fibromatosis: Secondary | ICD-10-CM | POA: Diagnosis not present

## 2020-12-08 DIAGNOSIS — M79671 Pain in right foot: Secondary | ICD-10-CM | POA: Diagnosis not present

## 2020-12-08 MED ORDER — NAPROXEN 500 MG PO TABS: 500.0000 mg | ORAL_TABLET | Freq: Two times a day (BID) | ORAL | 0 refills | Status: DC

## 2020-12-08 NOTE — Discharge Instructions (Signed)
Believe that you have a condition called plantar fasciitis which is inflammation of the connective tissue on the bottom of your foot.  Please use heat and gentle stretch for symptom relief.  I also recommend that you roll your foot over an ice water bottle or tennis ball when he first wake up in the morning to help stretch this area.  Start anti-inflammatories (Naprosyn) as prescribed.  Do not take NSAIDs with this medication including aspirin, ibuprofen/Advil, naproxen/Aleve.  If your symptoms are not improving please follow-up with podiatrist as we discussed.  If you have any sudden severe symptoms please return for reevaluation.

## 2020-12-08 NOTE — ED Triage Notes (Signed)
Pt is present today with right foot pain. Pt states that she cannot apply pressure on that foot. Pt states she noticed the pain x2 days ago.
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
## 2021-01-22 LAB — HM DIABETES EYE EXAM

## 2021-01-29 ENCOUNTER — Other Ambulatory Visit: Payer: Self-pay | Admitting: Registered Nurse

## 2021-01-29 ENCOUNTER — Other Ambulatory Visit: Payer: Self-pay

## 2021-01-29 ENCOUNTER — Ambulatory Visit (INDEPENDENT_AMBULATORY_CARE_PROVIDER_SITE_OTHER): Payer: Managed Care, Other (non HMO) | Admitting: Registered Nurse

## 2021-01-29 ENCOUNTER — Encounter: Payer: Self-pay | Admitting: Registered Nurse

## 2021-01-29 ENCOUNTER — Telehealth: Payer: Self-pay

## 2021-01-29 VITALS — BP 130/90 | HR 92 | Temp 98.4°F | Resp 18 | Ht 60.0 in | Wt 197.2 lb

## 2021-01-29 DIAGNOSIS — J45909 Unspecified asthma, uncomplicated: Secondary | ICD-10-CM

## 2021-01-29 DIAGNOSIS — Z6841 Body Mass Index (BMI) 40.0 and over, adult: Secondary | ICD-10-CM

## 2021-01-29 DIAGNOSIS — F4321 Adjustment disorder with depressed mood: Secondary | ICD-10-CM | POA: Diagnosis not present

## 2021-01-29 DIAGNOSIS — F419 Anxiety disorder, unspecified: Secondary | ICD-10-CM | POA: Diagnosis not present

## 2021-01-29 DIAGNOSIS — L309 Dermatitis, unspecified: Secondary | ICD-10-CM

## 2021-01-29 MED ORDER — ALBUTEROL SULFATE HFA 108 (90 BASE) MCG/ACT IN AERS: 1.0000 | INHALATION_SPRAY | Freq: Four times a day (QID) | RESPIRATORY_TRACT | 1 refills | Status: DC | PRN

## 2021-01-29 MED ORDER — FLUTICASONE-SALMETEROL 500-50 MCG/ACT IN AEPB: 1.0000 | INHALATION_SPRAY | Freq: Two times a day (BID) | RESPIRATORY_TRACT | 5 refills | Status: DC

## 2021-01-29 MED ORDER — ALBUTEROL SULFATE (2.5 MG/3ML) 0.083% IN NEBU: 2.5000 mg | INHALATION_SOLUTION | Freq: Four times a day (QID) | RESPIRATORY_TRACT | 1 refills | Status: DC | PRN

## 2021-01-29 MED ORDER — PHENTERMINE HCL 37.5 MG PO CAPS: 37.5000 mg | ORAL_CAPSULE | ORAL | 0 refills | Status: DC

## 2021-01-29 MED ORDER — TRIAMCINOLONE ACETONIDE 0.1 % EX CREA: 1.0000 "application " | TOPICAL_CREAM | Freq: Two times a day (BID) | CUTANEOUS | 2 refills | Status: DC

## 2021-01-29 NOTE — Telephone Encounter (Signed)
Called pharmacy and have resent with new quantity of 454 G to obtain a jar/canister form

## 2021-01-29 NOTE — Patient Instructions (Addendum)
Ms. Yoshimura -   GREAT WORK! Keep it up!  Phentermine refilled.   See you in 6 mo for a physical and labs  A1c today at: 5.6! Great work!   Have sent Advair Diskus to replace Qvar  [facility] walk in: [phone] [address]     Thank you  Luan Pulling     If you have lab work done today you will be contacted with your lab results within the next 2 weeks.  If you have not heard from [place] then please contact us. The fastest way to get your results is to register for My Chart.   IF you received an x-ray today, you will receive an invoice from [facility]. Please contact [facility] at [phone] with questions or concerns regarding your invoice.   IF you received labwork today, you will receive an invoice from [facility]. Please contact [facility] at [phone] with questions or concerns regarding your invoice.   Our billing staff will not be able to assist you with questions regarding bills from these companies.  You will be contacted with the lab results as soon as they are available. The fastest way to get your results is to activate your My Chart account. Instructions are located on the last page of this paperwork. If you have not heard from [place] regarding the results in 2 weeks, please contact this office.

## 2021-01-29 NOTE — Telephone Encounter (Signed)
Caller name:Karen Jensen   On DPR? :Yes  Call back [phone]  Provider they see: Delfino Lovett  Reason for call:Pt need refill ontriamcinolone (KENALOG) 0.1 % [id number] Pt needs this in a jar not the tube this does not cover her whole body

## 2021-01-29 NOTE — Progress Notes (Signed)
Established Patient Office Visit  Subjective:  Patient ID: Karen Jensen, female    DOB: [birth year]  Age: 36 y.o. MRN: [id number]  CC:  Chief Complaint  Patient presents with   Follow-up    Patient states she is here for a follow up on phentermine. Patient think she is doing well.    HPI Karen Jensen presents for follow up   Started on phentermine in October at 37.5mg  dose.  Has used most days since. No AE.  Has gone from 225 to 197lbs.   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 Lab Results  Component Value Date   HGBA1C 6.3 10/24/2020   Can continue to consider GLP1 as option for weight management   Also notes grief- Recent breakup that she is having a hard time with Trouble coping with losses in the past Struggling with sense of identity Denies hi/si Would like to see counselor and psychiatry.   Past Medical History:  Diagnosis Date   Anxiety    Arthritis    BMI 45.0-49.9, adult (HCC)    Chronic asthma    Chronic pain of right ankle    Chronic rhinitis    Eczema    Migraines    Smoker    Snoring     Past Surgical History:  Procedure Laterality Date   TYMPANOSTOMY TUBE PLACEMENT Bilateral     Family History  Problem Relation Age of Onset   Healthy Mother    Eczema Sister    Lupus Maternal Grandmother    Heart disease Paternal Grandmother     Social History   Socioeconomic History   Marital status: Single    Spouse name: n/a   Number of children: 0   Years of education: 12+   Highest education level: Not on file  Occupational History   Occupation: Set designer  Tobacco Use   Smoking status: Every Day    Packs/day: 1.00    Types: Cigarettes   Smokeless tobacco: Never  Vaping Use   Vaping Use: Never used  Substance and Sexual Activity   Alcohol use: No    Alcohol/week: 0.0 standard drinks   Drug use: Yes    Frequency: 7.0 times per week    Types: Marijuana   Sexual activity: Yes     Partners: Female  Other Topics Concern   Not on file  Social History Narrative   Completing her second year at [employer] and plans to transfer to [employer].   Lives alone.   Family lives close by.       Social Determinants of Health   Financial Resource Strain: Not on file  Food Insecurity: Not on file  Transportation Needs: Not on file  Physical Activity: Not on file  Stress: Not on file  Social Connections: Not on file  Intimate Partner Violence: Not on file    Outpatient Medications Prior to Visit  Medication Sig Dispense Refill   Acetaminophen (TYLENOL ARTHRITIS PAIN PO) Take by mouth.     gabapentin (NEURONTIN) 300 MG capsule Take 1 capsule (300 mg total) by mouth daily. 30 capsule 1   naproxen (NAPROSYN) 500 MG tablet Take 1 tablet (500 mg total) by mouth 2 (two) times daily. 30 tablet 0   triamcinolone (KENALOG) 0.1 % Apply 1 application topically 2 (two) times daily. If there's any way to cover a bottle or jar, that would be great - or if there's an alternative that allows greater disp amount that would be  awesome - looking for med potency topical steroid - thank you 320 g 2   albuterol (PROVENTIL) (2.5 MG/3ML) 0.083% nebulizer solution Take 3 mLs (2.5 mg total) by nebulization every 6 (six) hours as needed for wheezing or shortness of breath. 150 mL 1   albuterol (VENTOLIN HFA) 108 (90 Base) MCG/ACT inhaler Inhale 1-2 puffs into the lungs every 6 (six) hours as needed for wheezing or shortness of breath. 1 each 1   beclomethasone (QVAR) 80 MCG/ACT inhaler Inhale 1 puff into the lungs in the morning and at bedtime. 1 each 1   phentermine 37.5 MG capsule Take 1 capsule (37.5 mg total) by mouth every morning. 90 capsule 0   QVAR REDIHALER 80 MCG/ACT inhaler SMARTSIG:1 Puff(s) Via Inhaler Morning-Night     No facility-administered medications prior to visit.    Allergies  Allergen Reactions   Other     WOOL produces rash    ROS Review of Systems  Constitutional: Negative.    HENT: Negative.    Eyes: Negative.   Respiratory: Negative.    Cardiovascular: Negative.   Gastrointestinal: Negative.   Genitourinary: Negative.   Musculoskeletal: Negative.   Skin: Negative.   Neurological: Negative.   Psychiatric/Behavioral: Negative.    All other systems reviewed and are negative.    Objective:    Physical Exam Vitals and nursing note reviewed.  Constitutional:      General: She is not in acute distress.    Appearance: Normal appearance. She is normal weight. She is not ill-appearing, toxic-appearing or diaphoretic.  Cardiovascular:     Rate and Rhythm: Normal rate and regular rhythm.     Heart sounds: Normal heart sounds. No murmur heard.   No friction rub. No gallop.  Pulmonary:     Effort: Pulmonary effort is normal. No respiratory distress.     Breath sounds: Normal breath sounds. No stridor. No wheezing, rhonchi or rales.  Chest:     Chest wall: No tenderness.  Skin:    General: Skin is warm and dry.  Neurological:     General: No focal deficit present.     Mental Status: She is alert and oriented to person, place, and time. Mental status is at baseline.  Psychiatric:        Mood and Affect: Mood normal.        Behavior: Behavior normal.        Thought Content: Thought content normal.        Judgment: Judgment normal.    BP 130/90    Pulse 92    Temp 98.4 F (36.9 C) (Temporal)    Resp 18    Ht 5' (1.524 m)    Wt 197 lb 3.2 oz (89.4 kg)    SpO2 99%    BMI 38.51 kg/m  Wt Readings from Last 3 Encounters:  01/29/21 197 lb 3.2 oz (89.4 kg)  10/29/20 225 lb (102.1 kg)  10/24/20 225 lb (102.1 kg)     Health Maintenance Due  Topic Date Due   Pneumococcal Vaccine 79-55 Years old (1 - PCV) Never done   PAP SMEAR-Modifier  06/17/2015   COVID-19 Vaccine (2 - Pfizer series) 12/09/2019    There are no preventive care reminders to display for this patient.  Lab Results  Component Value Date   TSH 2.15 10/24/2020   Lab Results  Component  Value Date   WBC 10.4 10/24/2020   HGB 13.4 10/24/2020   HCT 39.7 10/24/2020   MCV 89.3 10/24/2020  PLT 309.0 10/24/2020   Lab Results  Component Value Date   NA 136 10/24/2020   K 4.3 10/24/2020   CO2 25 10/24/2020   GLUCOSE 100 (H) 10/24/2020   BUN 11 10/24/2020   CREATININE 0.71 10/24/2020   BILITOT 0.6 10/24/2020   ALKPHOS 60 10/24/2020   AST 12 10/24/2020   ALT 17 10/24/2020   PROT 6.7 10/24/2020   ALBUMIN 4.1 10/24/2020   CALCIUM 9.5 10/24/2020   GFR 111.66 10/24/2020   Lab Results  Component Value Date   CHOL 150 10/24/2020   Lab Results  Component Value Date   HDL 38.30 (L) 10/24/2020   Lab Results  Component Value Date   LDLCALC 75 10/24/2020   Lab Results  Component Value Date   TRIG 185.0 (H) 10/24/2020   Lab Results  Component Value Date   CHOLHDL 4 10/24/2020   Lab Results  Component Value Date   HGBA1C 6.3 10/24/2020      Assessment & Plan:   Problem List Items Addressed This Visit       Other   Anxiety   Relevant Orders   Ambulatory referral to Psychology   Ambulatory referral to Psychiatry   Other Visit Diagnoses     Grief    -  Primary   Relevant Orders   Ambulatory referral to Psychology   Ambulatory referral to Psychiatry   Morbid obesity with BMI of 40.0-44.9, adult (HCC)       Relevant Medications   phentermine 37.5 MG capsule   Asthma, chronic, unspecified asthma severity, uncomplicated       Relevant Medications   albuterol (PROVENTIL) (2.5 MG/3ML) 0.083% nebulizer solution   albuterol (VENTOLIN HFA) 108 (90 Base) MCG/ACT inhaler   fluticasone-salmeterol (ADVAIR DISKUS) 500-50 MCG/ACT AEPB       Meds ordered this encounter  Medications   phentermine 37.5 MG capsule    Sig: Take 1 capsule (37.5 mg total) by mouth every morning.    Dispense:  90 capsule    Refill:  0    Order Specific Question:   Supervising Provider    Answer:   Neva SeatGREENE, JEFFREY R [[id number]]   albuterol (PROVENTIL) (2.5 MG/3ML) 0.083% nebulizer  solution    Sig: Take 3 mLs (2.5 mg total) by nebulization every 6 (six) hours as needed for wheezing or shortness of breath.    Dispense:  150 mL    Refill:  1    Order Specific Question:   Supervising Provider    Answer:   Neva SeatGREENE, JEFFREY R [[id number]]   albuterol (VENTOLIN HFA) 108 (90 Base) MCG/ACT inhaler    Sig: Inhale 1-2 puffs into the lungs every 6 (six) hours as needed for wheezing or shortness of breath.    Dispense:  1 each    Refill:  1    Order Specific Question:   Supervising Provider    Answer:   Neva SeatGREENE, JEFFREY R [[id number]]   fluticasone-salmeterol (ADVAIR DISKUS) 500-50 MCG/ACT AEPB    Sig: Inhale 1 puff into the lungs in the morning and at bedtime.    Dispense:  60 each    Refill:  5    Order Specific Question:   Supervising Provider    Answer:   Neva SeatGREENE, JEFFREY R [[id number]]    Follow-up: Return in about 6 months (around 07/29/2021) for CPE and labs.   PLAN Continue phentermine 37.5mg  po qd Med check in 6 mo Encourage pt to continue healthy diet and exercise Refer to counseling and psychiatry.  Patient  encouraged to call clinic with any questions, comments, or concerns.  Maximiano Coss, NP

## 2021-02-01 ENCOUNTER — Other Ambulatory Visit: Payer: Self-pay | Admitting: Registered Nurse

## 2021-02-01 DIAGNOSIS — J45909 Unspecified asthma, uncomplicated: Secondary | ICD-10-CM

## 2021-03-05 ENCOUNTER — Ambulatory Visit: Payer: Managed Care, Other (non HMO) | Admitting: Psychology

## 2021-04-23 ENCOUNTER — Other Ambulatory Visit: Payer: Self-pay

## 2021-04-23 MED ORDER — PHENTERMINE HCL 37.5 MG PO CAPS: 37.5000 mg | ORAL_CAPSULE | ORAL | 0 refills | Status: DC

## 2021-04-23 NOTE — Telephone Encounter (Signed)
MEDICATION:phentermine 37.5 MG capsule ? ?PHARMACY:[id number] [place], [place] - [address] ? ?Comments:  ? ?**Let patient know to contact pharmacy at the end of the day to make sure medication is ready. ** ? ?** Please notify patient to allow 48-72 hours to process** ? ?**Encourage patient to contact the pharmacy for refills or they can request refills through [facility]** ?  ?

## 2021-04-23 NOTE — Telephone Encounter (Signed)
Patient is requesting a refill of the following medications: ?Requested Prescriptions  ? ?Pending Prescriptions Disp Refills  ? phentermine 37.5 MG capsule 90 capsule 0  ?  Sig: Take 1 capsule (37.5 mg total) by mouth every morning.  ? ? ?Date of patient request: 04/23/2021 ?Last office visit: 01/29/2021 ?Date of last refill: 01/29/2021 ?Last refill amount: 90 capsules  ?Follow up time period per chart: 07/30/2021  ?

## 2021-06-22 ENCOUNTER — Encounter (HOSPITAL_COMMUNITY): Payer: Self-pay | Admitting: *Deleted

## 2021-06-22 ENCOUNTER — Ambulatory Visit (HOSPITAL_COMMUNITY)
Admission: EM | Admit: 2021-06-22 | Discharge: 2021-06-22 | Disposition: A | Discharge status: Home / Self Care | Payer: Managed Care, Other (non HMO) | Attending: Physician Assistant | Admitting: Physician Assistant

## 2021-06-22 ENCOUNTER — Other Ambulatory Visit: Payer: Self-pay

## 2021-06-22 DIAGNOSIS — M7661 Achilles tendinitis, right leg: Secondary | ICD-10-CM

## 2021-06-22 MED ORDER — PREDNISONE 10 MG (21) PO TBPK: ORAL_TABLET | ORAL | 0 refills | Status: DC

## 2021-06-22 NOTE — ED Triage Notes (Signed)
Pt reports Planter fascitis RT foot. Pt reports having pain for 3 weeks . Pt has been taking Advil but still hurts.

## 2021-06-22 NOTE — ED Provider Notes (Signed)
[facility]    CSN: [id number] Arrival date & time: 06/22/21  1613      History   Chief Complaint Chief Complaint  Patient presents with   Foot Pain    HPI Karen Jensen is a 36 y.o. female.   Patient presents today with a 3-week history of intermittent right heel pain with radiation into lower leg.  She does have a history of plantar fasciitis in this foot and states current symptoms are similar but in a different distribution than previous episodes of this condition.  She has been taking ibuprofen 400 mg 4 times daily with minimal improvement of symptoms.  She does report using stretching and heat for additional symptom relief.  She denies any known injury but does report increase in activity as she has been exercising more and playing basketball.  She has not seen a specialist in the past.  Denies any swelling, weakness, numbness, paresthesias.  She is able to ambulate unassisted.  Denies history of diabetes.  She is confident she is not pregnant.   Past Medical History:  Diagnosis Date   Anxiety    Arthritis    BMI 45.0-49.9, adult (HCC)    Chronic asthma    Chronic pain of right ankle    Chronic rhinitis    Eczema    Migraines    Smoker    Snoring     Patient Active Problem List   Diagnosis Date Noted   Smoker 04/27/2019   Anxiety 06/24/2018   Chronic rhinitis 06/24/2018   BMI 45.0-49.9, adult (HCC) 06/24/2018   Migraine 01/24/2015   Asthma, chronic 01/24/2015   Carpal tunnel syndrome 01/24/2015    Past Surgical History:  Procedure Laterality Date   TYMPANOSTOMY TUBE PLACEMENT Bilateral     OB History   No obstetric history on file.      Home Medications    Prior to Admission medications   Medication Sig Start Date End Date Taking? Authorizing Provider  predniSONE (STERAPRED UNI-PAK 21 TAB) 10 MG (21) TBPK tablet As directed 06/22/21  Yes Neeka Urista K, PA-C  Acetaminophen (TYLENOL ARTHRITIS PAIN PO) Take by mouth.    [provider]  albuterol (PROVENTIL) (2.5 MG/3ML) 0.083% nebulizer solution Take 3 mLs (2.5 mg total) by nebulization every 6 (six) hours as needed for wheezing or shortness of breath. 01/29/21   Janeece Agee, NP  albuterol (VENTOLIN HFA) 108 (90 Base) MCG/ACT inhaler Inhale 1-2 puffs into the lungs every 6 (six) hours as needed for wheezing or shortness of breath. 01/29/21   Janeece Agee, NP  fluticasone-salmeterol (ADVAIR DISKUS) 500-50 MCG/ACT AEPB Inhale 1 puff into the lungs in the morning and at bedtime. 01/29/21   Janeece Agee, NP  gabapentin (NEURONTIN) 300 MG capsule Take 1 capsule (300 mg total) by mouth daily. 10/23/20   Gustavus Bryant, FNP  phentermine 37.5 MG capsule Take 1 capsule (37.5 mg total) by mouth every morning. 04/23/21   Janeece Agee, NP  triamcinolone cream (KENALOG) 0.1 % Apply 1 application topically 2 (two) times daily. If there's any way to cover a bottle or jar, that would be great 01/29/21   Janeece Agee, NP    Family History Family History  Problem Relation Age of Onset   Healthy Mother    Eczema Sister    Lupus Maternal Grandmother    Heart disease Paternal Grandmother     Social History Social History   Tobacco Use   Smoking status: Every Day  Packs/day: 1.00    Types: Cigarettes   Smokeless tobacco: Never  Vaping Use   Vaping Use: Never used  Substance Use Topics   Alcohol use: No    Alcohol/week: 0.0 standard drinks   Drug use: Yes    Frequency: 7.0 times per week    Types: Marijuana     Allergies   Other   Review of Systems Review of Systems  Constitutional:  Positive for activity change. Negative for appetite change, fatigue and fever.  Musculoskeletal:  Positive for arthralgias and gait problem. Negative for joint swelling and myalgias.  Skin:  Negative for color change and wound.  Neurological:  Negative for weakness and numbness.    Physical Exam Triage Vital Signs ED Triage Vitals  Enc Vitals Group     BP  06/22/21 1633 107/74     Pulse Rate 06/22/21 1633 95     Resp 06/22/21 1633 18     Temp 06/22/21 1633 99 F (37.2 C)     Temp src --      SpO2 06/22/21 1633 99 %     Weight --      Height --      Head Circumference --      Peak Flow --      Pain Score 06/22/21 1631 8     Pain Loc --      Pain Edu? --      Excl. in GC? --    No data found.  Updated Vital Signs BP 107/74   Pulse 95   Temp 99 F (37.2 C)   Resp 18   LMP 06/02/2021   SpO2 99%   Visual Acuity Right Eye Distance:   Left Eye Distance:   Bilateral Distance:    Right Eye Near:   Left Eye Near:    Bilateral Near:     Physical Exam Vitals reviewed.  Constitutional:      General: She is awake. She is not in acute distress.    Appearance: Normal appearance. She is well-developed. She is not ill-appearing.     Comments: Very pleasant female appears stated age in no acute distress sitting comfortably in exam room  HENT:     Head: Normocephalic and atraumatic.  Cardiovascular:     Rate and Rhythm: Normal rate and regular rhythm.     Pulses:          Posterior tibial pulses are 2+ on the right side and 2+ on the left side.     Heart sounds: Normal heart sounds, S1 normal and S2 normal. No murmur heard.    Comments: Capillary refill within 2 seconds right toes Pulmonary:     Effort: Pulmonary effort is normal.     Breath sounds: Normal breath sounds. No wheezing, rhonchi or rales.     Comments: Clear to auscultation bilaterally Abdominal:     Palpations: Abdomen is soft.     Tenderness: There is no abdominal tenderness.  Musculoskeletal:     Right lower leg: Tenderness present. No bony tenderness. No edema.     Left lower leg: No edema.     Right ankle: No swelling. No tenderness. Normal range of motion. Anterior drawer test negative.     Right Achilles Tendon: Tenderness present. No defects. Thompson's test negative.     Comments: Tenderness palpation over right Achilles and into gastrocnemius.  No  deformity noted.  Strength 5/5 at the knee and ankle.  Foot neurovascularly intact.  Psychiatric:  Behavior: Behavior is cooperative.     UC Treatments / Results  Labs (all labs ordered are listed, but only abnormal results are displayed) Labs Reviewed - No data to display  EKG   Radiology No results found.  Procedures Procedures (including critical care time)  Medications Ordered in UC Medications - No data to display  Initial Impression / Assessment and Plan / UC Course  I have reviewed the triage vital signs and the nursing notes.  Pertinent labs & imaging results that were available during my care of the patient were reviewed by me and considered in my medical decision making (see chart for details).     No indication for plain films as patient denies any recent trauma and has no significant bony tenderness.  Discussed symptoms are likely related to Achilles tendinitis.  Patient has failed NSAIDs so we will use prednisone taper.  She was instructed not to take NSAIDs with this medication due to risk of GI bleeding.  Can use Tylenol for breakthrough pain.  Recommended conservative treatment measures including decreased activity as well as heat.  Also encouraged her to rest and stretch for symptom relief.  If symptoms or not improving she is to follow-up with sports medicine provider was given contact information for local provider with instruction to call to schedule an appointment.  Discussed that if she has any worsening symptoms including increased pain, difficulty walking, swelling, numbness, paresthesias she is to be seen immediately to which she expressed understanding.  Work excuse note provided.  Final Clinical Impressions(s) / UC Diagnoses   Final diagnoses:  Achilles tendinitis of right lower extremity     Discharge Instructions      I believe that you have inflammation of your Achilles tendon.  Please use heat and stretch for symptom relief.  We are going  to start a prednisone taper.  Do not take NSAIDs including aspirin, ibuprofen/Advil, naproxen/Aleve with this medication as it can cause stomach bleeding.  You can use Tylenol for breakthrough pain.  Make sure you are wearing supportive footwear.  If your symptoms are not improving you should follow-up with sports medicine provider; call to schedule an appointment.  If anything worsens and you have severe pain, difficulty walking, swelling, numbness, weakness you need to be seen immediately.     ED Prescriptions     Medication Sig Dispense Auth. Provider   predniSONE (STERAPRED UNI-PAK 21 TAB) 10 MG (21) TBPK tablet As directed 21 tablet Amadu Schlageter K, PA-C      PDMP not reviewed this encounter.   Jeani Hawking, PA-C 06/22/21 1648

## 2021-06-22 NOTE — Discharge Instructions (Signed)
I believe that you have inflammation of your Achilles tendon.  Please use heat and stretch for symptom relief.  We are going to start a prednisone taper.  Do not take NSAIDs including aspirin, ibuprofen/Advil, naproxen/Aleve with this medication as it can cause stomach bleeding.  You can use Tylenol for breakthrough pain.  Make sure you are wearing supportive footwear.  If your symptoms are not improving you should follow-up with sports medicine provider; call to schedule an appointment.  If anything worsens and you have severe pain, difficulty walking, swelling, numbness, weakness you need to be seen immediately.

## 2021-07-26 ENCOUNTER — Ambulatory Visit
Admission: EM | Admit: 2021-07-26 | Discharge: 2021-07-26 | Disposition: A | Discharge status: Home / Self Care | Payer: Managed Care, Other (non HMO) | Attending: Internal Medicine | Admitting: Internal Medicine

## 2021-07-26 DIAGNOSIS — M722 Plantar fascial fibromatosis: Secondary | ICD-10-CM

## 2021-07-26 DIAGNOSIS — G5601 Carpal tunnel syndrome, right upper limb: Secondary | ICD-10-CM

## 2021-07-26 MED ORDER — MELOXICAM 7.5 MG PO TABS: 7.5000 mg | ORAL_TABLET | Freq: Every day | ORAL | 0 refills | Status: DC

## 2021-07-26 NOTE — Discharge Instructions (Addendum)
These take medications as prescribed Gentle stretching exercises Rolled to the bottom of your foot over baseball or a bottle of cold water Gentle stretching exercises Return to urgent care if pain worsens. Wear right wrist brace. As the pain improves, please do gentle range of motion exercises.

## 2021-07-26 NOTE — ED Triage Notes (Signed)
Pt presents with ongoing right wrist and right foot pain from tendonitis; pt has been unable to follow up due to lack of insurance.

## 2021-07-28 NOTE — ED Provider Notes (Signed)
[facility]    CSN: [id number] Arrival date & time: 07/26/21  1125      History   Chief Complaint Chief Complaint  Patient presents with   Wrist Pain   Foot Pain    HPI Karen Jensen is a 36 y.o. female presents to urgent care with right wrist pain and right heel pain.  Patient has a history of carpal tunnel involving the right hand.  Pain is currently throbbing and of moderate severity.  It is aggravated by movement and patient denies any relieving factors.  Her job involves repetitive and movement which makes the pain worse.  No radiation of the pain.  She has numbness and tingling of the right index and middle fingers.  No bruising. Patient also complains of right heel pain which is mild to moderate severity.  Pain radiates into the foot.  She has not tried any medication to help with her foot pain.  No swelling of the foot no bruising.  No trauma to the left foot.Marland Kitchen   HPI  Past Medical History:  Diagnosis Date   Anxiety    Arthritis    BMI 45.0-49.9, adult (HCC)    Chronic asthma    Chronic pain of right ankle    Chronic rhinitis    Eczema    Migraines    Smoker    Snoring     Patient Active Problem List   Diagnosis Date Noted   Smoker 04/27/2019   Anxiety 06/24/2018   Chronic rhinitis 06/24/2018   BMI 45.0-49.9, adult ([place]) 06/24/2018   Migraine 01/24/2015   Asthma, chronic 01/24/2015   Carpal tunnel syndrome 01/24/2015    Past Surgical History:  Procedure Laterality Date   TYMPANOSTOMY TUBE PLACEMENT Bilateral     OB History   No obstetric history on file.      Home Medications    Prior to Admission medications   Medication Sig Start Date End Date Taking? Authorizing Provider  meloxicam (MOBIC) 7.5 MG tablet Take 1 tablet (7.5 mg total) by mouth daily. 07/26/21  Yes Javarious Elsayed, Myrene Galas, MD  Acetaminophen (TYLENOL ARTHRITIS PAIN PO) Take by mouth.    [provider]  albuterol (PROVENTIL) (2.5 MG/3ML) 0.083% nebulizer  solution Take 3 mLs (2.5 mg total) by nebulization every 6 (six) hours as needed for wheezing or shortness of breath. 01/29/21   Maximiano Coss, NP  albuterol (VENTOLIN HFA) 108 (90 Base) MCG/ACT inhaler Inhale 1-2 puffs into the lungs every 6 (six) hours as needed for wheezing or shortness of breath. 01/29/21   Maximiano Coss, NP  fluticasone-salmeterol (ADVAIR DISKUS) 500-50 MCG/ACT AEPB Inhale 1 puff into the lungs in the morning and at bedtime. 01/29/21   Maximiano Coss, NP  gabapentin (NEURONTIN) 300 MG capsule Take 1 capsule (300 mg total) by mouth daily. 10/23/20   Teodora Medici, FNP  phentermine 37.5 MG capsule Take 1 capsule (37.5 mg total) by mouth every morning. 04/23/21   Maximiano Coss, NP  predniSONE (STERAPRED UNI-PAK 21 TAB) 10 MG (21) TBPK tablet As directed 06/22/21   Raspet, Erin K, PA-C  triamcinolone cream (KENALOG) 0.1 % Apply 1 application topically 2 (two) times daily. If there's any way to cover a bottle or jar, that would be great 01/29/21   Maximiano Coss, NP    Family History Family History  Problem Relation Age of Onset   Healthy Mother    Eczema Sister    Lupus Maternal Grandmother    Heart disease Paternal Grandmother  Social History Social History   Tobacco Use   Smoking status: Every Day    Packs/day: 1.00    Types: Cigarettes   Smokeless tobacco: Never  Vaping Use   Vaping Use: Never used  Substance Use Topics   Alcohol use: No    Alcohol/week: 0.0 standard drinks of alcohol   Drug use: Yes    Frequency: 7.0 times per week    Types: Marijuana     Allergies   Other   Review of Systems Review of Systems As per HPI  Physical Exam Triage Vital Signs ED Triage Vitals  Enc Vitals Group     BP 07/26/21 1154 118/80     Pulse Rate 07/26/21 1154 94     Resp 07/26/21 1154 18     Temp 07/26/21 1154 98.3 F (36.8 C)     Temp Source 07/26/21 1154 Oral     SpO2 07/26/21 1154 97 %     Weight --      Height --      Head Circumference --       Peak Flow --      Pain Score 07/26/21 1158 7     Pain Loc --      Pain Edu? --      Excl. in GC? --    No data found.  Updated Vital Signs BP 118/80 (BP Location: Left Arm)   Pulse 94   Temp 98.3 F (36.8 C) (Oral)   Resp 18   LMP  (LMP Unknown)   SpO2 97%   Visual Acuity Right Eye Distance:   Left Eye Distance:   Bilateral Distance:    Right Eye Near:   Left Eye Near:    Bilateral Near:     Physical Exam Vitals and nursing note reviewed.  Constitutional:      Appearance: Normal appearance.  Pulmonary:     Effort: Pulmonary effort is normal.     Breath sounds: Normal breath sounds.  Musculoskeletal:     Comments: Full range of motion of the right wrist.  No thenar or hypothenar muscle atrophy or wasting.  No bruising of the right wrist.  Power is normal in the right hand.  Right heel tenderness with no bruising.  Full range of motion of the right ankle.  Skin:    General: Skin is warm.  Neurological:     Mental Status: She is alert.      UC Treatments / Results  Labs (all labs ordered are listed, but only abnormal results are displayed) Labs Reviewed - No data to display  EKG   Radiology No results found.  Procedures Procedures (including critical care time)  Medications Ordered in UC Medications - No data to display  Initial Impression / Assessment and Plan / UC Course  I have reviewed the triage vital signs and the nursing notes.  Pertinent labs & imaging results that were available during my care of the patient were reviewed by me and considered in my medical decision making (see chart for details).     1.  Carpal tunnel syndrome right wrist: Continue to wear your wrist brace Meloxicam 7.5 mg orally daily Icing of the right wrist.  Range of motion exercises. No indication for imaging at this time. Return to urgent care if symptoms worsen.  2.  Plantar fasciitis of the right foot: Plantar fasciitis return precautions given NSAIDs as  above Foot exercises discussed Return precautions given No indication for imaging at this time. Final Clinical  Impressions(s) / UC Diagnoses   Final diagnoses:  Carpal tunnel syndrome on right  Plantar fasciitis of right foot     Discharge Instructions      These take medications as prescribed Gentle stretching exercises Rolled to the bottom of your foot over baseball or a bottle of cold water Gentle stretching exercises Return to urgent care if pain worsens. Wear right wrist brace. As the pain improves, please do gentle range of motion exercises.    ED Prescriptions     Medication Sig Dispense Auth. Provider   meloxicam (MOBIC) 7.5 MG tablet Take 1 tablet (7.5 mg total) by mouth daily. 30 tablet Troi Bechtold, Britta Mccreedy, MD      PDMP not reviewed this encounter.   Merrilee Jansky, MD 07/28/21 2329

## 2021-07-30 ENCOUNTER — Ambulatory Visit: Payer: Managed Care, Other (non HMO) | Admitting: Registered Nurse

## 2021-11-18 ENCOUNTER — Encounter: Payer: Self-pay | Admitting: Nurse Practitioner

## 2021-11-18 ENCOUNTER — Ambulatory Visit (INDEPENDENT_AMBULATORY_CARE_PROVIDER_SITE_OTHER): Payer: BC Managed Care – PPO | Admitting: Nurse Practitioner

## 2021-11-18 VITALS — BP 100/82 | HR 83 | Temp 96.5°F | Wt 177.2 lb

## 2021-11-18 DIAGNOSIS — N920 Excessive and frequent menstruation with regular cycle: Secondary | ICD-10-CM

## 2021-11-18 DIAGNOSIS — E669 Obesity, unspecified: Secondary | ICD-10-CM

## 2021-11-18 DIAGNOSIS — R7303 Prediabetes: Secondary | ICD-10-CM | POA: Diagnosis not present

## 2021-11-18 DIAGNOSIS — L309 Dermatitis, unspecified: Secondary | ICD-10-CM | POA: Diagnosis not present

## 2021-11-18 DIAGNOSIS — Z13220 Encounter for screening for lipoid disorders: Secondary | ICD-10-CM | POA: Diagnosis not present

## 2021-11-18 DIAGNOSIS — G5603 Carpal tunnel syndrome, bilateral upper limbs: Secondary | ICD-10-CM

## 2021-11-18 DIAGNOSIS — J45909 Unspecified asthma, uncomplicated: Secondary | ICD-10-CM | POA: Diagnosis not present

## 2021-11-18 DIAGNOSIS — R220 Localized swelling, mass and lump, head: Secondary | ICD-10-CM

## 2021-11-18 DIAGNOSIS — F17200 Nicotine dependence, unspecified, uncomplicated: Secondary | ICD-10-CM

## 2021-11-18 LAB — COMPREHENSIVE METABOLIC PANEL
ALT: 20 U/L (ref 0–35)
AST: 15 U/L (ref 0–37)
Albumin: 4.1 g/dL (ref 3.5–5.2)
Alkaline Phosphatase: 58 U/L (ref 39–117)
BUN: 12 mg/dL (ref 6–23)
CO2: 22 mEq/L (ref 19–32)
Calcium: 9.3 mg/dL (ref 8.4–10.5)
Chloride: 108 mEq/L (ref 96–112)
Creatinine, Ser: 0.73 mg/dL (ref 0.40–1.20)
GFR: 107.19 mL/min (ref >60.00)
Glucose, Bld: 100 mg/dL — ABNORMAL HIGH (ref 70–99)
Potassium: 4 mEq/L (ref 3.5–5.1)
Sodium: 138 mEq/L (ref 135–145)
Total Bilirubin: 0.9 mg/dL (ref 0.2–1.2)
Total Protein: 6.5 g/dL (ref 6.0–8.3)

## 2021-11-18 LAB — LIPID PANEL
Cholesterol: 158 mg/dL (ref 0–200)
HDL: 44.4 mg/dL
LDL Cholesterol: 83 mg/dL (ref 0–99)
NonHDL: 113.25
Total CHOL/HDL Ratio: 4
Triglycerides: 152 mg/dL — ABNORMAL HIGH (ref 0.0–149.0)
VLDL: 30.4 mg/dL (ref 0.0–40.0)

## 2021-11-18 LAB — CBC
HCT: 40.1 % (ref 36.0–46.0)
Hemoglobin: 13.3 g/dL (ref 12.0–15.0)
MCHC: 33.2 g/dL (ref 30.0–36.0)
MCV: 91.1 fl (ref 78.0–100.0)
Platelets: 296 K/uL (ref 150.0–400.0)
RBC: 4.4 Mil/uL (ref 3.87–5.11)
RDW: 13.7 % (ref 11.5–15.5)
WBC: 7.9 K/uL (ref 4.0–10.5)

## 2021-11-18 LAB — HEMOGLOBIN A1C: Hgb A1c MFr Bld: 5.8 % (ref 4.6–6.5)

## 2021-11-18 MED ORDER — GABAPENTIN 300 MG PO CAPS: 300.0000 mg | ORAL_CAPSULE | Freq: Every day | ORAL | 1 refills | Status: DC

## 2021-11-18 MED ORDER — PHENTERMINE HCL 37.5 MG PO CAPS: 37.5000 mg | ORAL_CAPSULE | ORAL | 0 refills | Status: DC

## 2021-11-18 MED ORDER — TRIAMCINOLONE ACETONIDE 0.1 % EX CREA: 1.0000 | TOPICAL_CREAM | Freq: Two times a day (BID) | CUTANEOUS | 2 refills | Status: DC

## 2021-11-18 MED ORDER — ONDANSETRON HCL 4 MG PO TABS: 4.0000 mg | ORAL_TABLET | Freq: Three times a day (TID) | ORAL | 1 refills | Status: DC | PRN

## 2021-11-18 MED ORDER — ALBUTEROL SULFATE HFA 108 (90 BASE) MCG/ACT IN AERS: 1.0000 | INHALATION_SPRAY | Freq: Four times a day (QID) | RESPIRATORY_TRACT | 1 refills | Status: DC | PRN

## 2021-11-18 MED ORDER — FLUTICASONE-SALMETEROL 500-50 MCG/ACT IN AEPB: 1.0000 | INHALATION_SPRAY | Freq: Two times a day (BID) | RESPIRATORY_TRACT | 5 refills | Status: DC

## 2021-11-18 NOTE — Assessment & Plan Note (Addendum)
Last A1c a year ago was 6.3%.  We will check A1c, CMP, and CBC today.  Discussed nutrition, exercise.

## 2021-11-18 NOTE — Assessment & Plan Note (Signed)
Chronic, stable.  She is taking gabapentin 300 mg daily, wears a wrist brace as needed.  Refill sent to the pharmacy.  Follow-up as symptoms worsen or any concerns.

## 2021-11-18 NOTE — Assessment & Plan Note (Signed)
She states that she had swelling to her lip after eating Tiger sauce.  She has since avoided this condiment has not had any symptoms since.  She is interested in getting allergy testing.  Referral placed to allergy.

## 2021-11-18 NOTE — Assessment & Plan Note (Signed)
Chronic, stable.  Continue Advair inhaler 1 puff twice a day and albuterol inhaler as needed.  Refill sent to the pharmacy.  She declines pneumonia and flu vaccine today.

## 2021-11-18 NOTE — Progress Notes (Signed)
New Patient Visit  BP 100/82   Pulse 83   Temp (!) 96.5 F (35.8 C) (Temporal)   Wt 177 lb 3.2 oz (80.4 kg)   SpO2 98%   BMI 34.61 kg/m    Subjective:    Patient ID: Karen Jensen, female    DOB: 12-02-1985, 36 y.o.   MRN: [id number]  CC: Chief Complaint  Patient presents with   Transitions Of Care    TOC. Est care. No main concerns. Pt requesting food allergy test due to recent allergic reaction w/ ingrediant in food sauce. Pt unsure if that's what caused her reaction. Requesting A1c, recently told by previous pcp possible pre-diabetes dx    HPI: Karen Jensen is a 36 y.o. female transfer care to a new provider.  Introduced to Designer, jewellery role and practice setting.  All questions answered.  Discussed provider/patient relationship and expectations.  She states that a few weeks ago she had swelling in her lip after eating tiger sauce. She states this lasted about a week. She has stopped eating this sauce and has not had any reactions since. She would like a referral for allergy testing.   She has chronic pain in her right heel. The pain has caused her to limp at times. She was diagnosed with plantar fasciitis. She takes excedrin or ibuprofen as needed for pain.   She also has a history of carpal tunnel in both wrists. She takes gabapentin 300mg  daily. She will also wear a wrist brace if the pain flares.   She states that she would like a refill on her phentermine. She states that 225 pounds was her highest weight. She last took phentermine in June. She likes to exercise almost every day. She has been trying to increase her vegetables. She is concerned with her prediabetes and does not want to progress to diabetes.   She has been experiencing menorrhagia for several years. She endorses severe pain and cramping along with nausea. She uses a heating pad. Her periods last 1 week and the first 3.5 days are the worst. She has tried depo provera and a birth control  patch in the past, however does not want any more hormones.   She has a history of asthma. She uses an advair daily. She uses albuterol rarely, usually only if has a cold. Her symptoms are well controlled. She denies chest pain and shortness of breath.   Depression and Anxiety Screen done:     11/18/2021    9:56 AM 01/29/2021    8:52 AM 10/24/2020    9:20 AM 10/24/2020    9:10 AM 03/02/2020    8:01 AM  Depression screen PHQ 2/9  Decreased Interest  2 1 0 0  Down, Depressed, Hopeless 0 2 0 0 0  PHQ - 2 Score 0 4 1 0 0  Altered sleeping 1 2 3  0   Tired, decreased energy 1 0 3 0   Change in appetite 2  3 0   Feeling bad or failure about yourself  0 2 0 0   Trouble concentrating 1 0 2 0   Moving slowly or fidgety/restless 0 0 1 0   Suicidal thoughts 0 0 0 0   PHQ-9 Score 5 8 13  0   Difficult doing work/chores Somewhat difficult Not difficult at all Somewhat difficult Not difficult at all       11/18/2021    9:56 AM  GAD 7 : Generalized Anxiety Score  Nervous, Anxious, on Edge  0  Control/stop worrying 0  Worry too much - different things 0  Trouble relaxing 0  Restless 0  Easily annoyed or irritable 2  Afraid - awful might happen 1  Total GAD 7 Score 3  Anxiety Difficulty Somewhat difficult    Past Medical History:  Diagnosis Date   Anxiety    Arthritis    BMI 45.0-49.9, adult (HCC)    Chronic asthma    Chronic pain of right ankle    Chronic rhinitis    Eczema    Migraines    Prediabetes    Smoker    Snoring     Past Surgical History:  Procedure Laterality Date   TYMPANOSTOMY TUBE PLACEMENT Bilateral     Family History  Problem Relation Age of Onset   Thyroid disease Mother    Eczema Sister    Lupus Maternal Grandmother    Heart disease Paternal Grandmother      Social History   Tobacco Use   Smoking status: Every Day    Packs/day: 0.50    Years: 20.00    Total pack years: 10.00    Types: Cigarettes   Smokeless tobacco: Never  Vaping Use   Vaping  Use: Never used  Substance Use Topics   Alcohol use: No    Alcohol/week: 0.0 standard drinks of alcohol   Drug use: Yes    Frequency: 7.0 times per week    Types: Marijuana    Current Outpatient Medications on File Prior to Visit  Medication Sig Dispense Refill   Acetaminophen (TYLENOL ARTHRITIS PAIN PO) Take by mouth.     albuterol (PROVENTIL) (2.5 MG/3ML) 0.083% nebulizer solution Take 3 mLs (2.5 mg total) by nebulization every 6 (six) hours as needed for wheezing or shortness of breath. 150 mL 1   No current facility-administered medications on file prior to visit.     Review of Systems  Constitutional: Negative.   HENT: Negative.    Respiratory: Negative.    Cardiovascular: Negative.   Gastrointestinal: Negative.   Genitourinary:  Positive for menstrual problem. Negative for dysuria and frequency.       Heavy menstrual periods   Musculoskeletal:  Positive for arthralgias (carpal tunnel bilateral, right heel pain).  Skin: Negative.   Neurological: Negative.   Psychiatric/Behavioral: Negative.        Objective:    BP 100/82   Pulse 83   Temp (!) 96.5 F (35.8 C) (Temporal)   Wt 177 lb 3.2 oz (80.4 kg)   SpO2 98%   BMI 34.61 kg/m   Wt Readings from Last 3 Encounters:  11/18/21 177 lb 3.2 oz (80.4 kg)  01/29/21 197 lb 3.2 oz (89.4 kg)  10/29/20 225 lb (102.1 kg)    BP Readings from Last 3 Encounters:  11/18/21 100/82  07/26/21 118/80  06/22/21 107/74    Physical Exam Vitals and nursing note reviewed.  Constitutional:      General: She is not in acute distress.    Appearance: Normal appearance.  HENT:     Head: Normocephalic and atraumatic.     Right Ear: Tympanic membrane, ear canal and external ear normal.     Left Ear: Tympanic membrane, ear canal and external ear normal.  Eyes:     Conjunctiva/sclera: Conjunctivae normal.  Cardiovascular:     Rate and Rhythm: Normal rate and regular rhythm.     Pulses: Normal pulses.     Heart sounds: Normal heart  sounds.  Pulmonary:     Effort: Pulmonary  effort is normal.     Breath sounds: Normal breath sounds.  Abdominal:     Palpations: Abdomen is soft.     Tenderness: There is no abdominal tenderness.  Musculoskeletal:        General: Normal range of motion.     Cervical back: Normal range of motion and neck supple. No tenderness.     Right lower leg: No edema.     Left lower leg: No edema.  Lymphadenopathy:     Cervical: No cervical adenopathy.  Skin:    General: Skin is warm and dry.  Neurological:     General: No focal deficit present.     Mental Status: She is alert and oriented to person, place, and time.     Cranial Nerves: No cranial nerve deficit.     Coordination: Coordination normal.     Gait: Gait normal.  Psychiatric:        Mood and Affect: Mood normal.        Behavior: Behavior normal.        Thought Content: Thought content normal.        Judgment: Judgment normal.        Assessment & Plan:   Problem List Items Addressed This Visit       Respiratory   Asthma, chronic, unspecified asthma severity, uncomplicated - Primary    Chronic, stable.  Continue Advair inhaler 1 puff twice a day and albuterol inhaler as needed.  Refill sent to the pharmacy.  She declines pneumonia and flu vaccine today.      Relevant Medications   fluticasone-salmeterol (ADVAIR DISKUS) 500-50 MCG/ACT AEPB   albuterol (VENTOLIN HFA) 108 (90 Base) MCG/ACT inhaler     Nervous and Auditory   Carpal tunnel syndrome    Chronic, stable.  She is taking gabapentin 300 mg daily, wears a wrist brace as needed.  Refill sent to the pharmacy.  Follow-up as symptoms worsen or any concerns.      Relevant Medications   gabapentin (NEURONTIN) 300 MG capsule   phentermine 37.5 MG capsule     Musculoskeletal and Integument   Eczema    Chronic, stable.  She uses triamcinolone cream twice a day as needed.  Refill sent to the pharmacy.      Relevant Medications   triamcinolone cream (KENALOG) 0.1 %      Other   Smoker    She is currently smoking half a pack a day.  Encouraged complete tobacco cessation.      Obesity (BMI 30-39.9)    BMI is 34.6.  She has lost 48 pounds over the last year, congratulated her on this.  She is interested in restarting phentermine.  PDMP reviewed.  We will have her start phentermine 37.5 mg daily for 3 months.  Follow-up with any side effects or concerns.      Relevant Medications   phentermine 37.5 MG capsule   Menorrhagia with regular cycle    She has been having menorrhagia for several years.  She states that her menstrual period last for 7 days in the first 3-1/2 days are the worst.  She has done Depo-Provera injections and the birth control patch in the past, however wants to stay away from hormones.  Discussed that she can take ibuprofen or naproxen as needed for cramping and will prescribe Zofran 4 mg as needed for nausea.  Follow-up if symptoms worsen or with any concerns.      Prediabetes    Last A1c a year  ago was 6.3%.  We will check A1c, CMP, and CBC today.  Discussed nutrition, exercise.      Relevant Orders   CBC   Comprehensive metabolic panel   Hemoglobin A1c   Lip swelling    She states that she had swelling to her lip after eating Tiger sauce.  She has since avoided this condiment has not had any symptoms since.  She is interested in getting allergy testing.  Referral placed to allergy.      Relevant Orders   Ambulatory referral to Allergy   Other Visit Diagnoses     Screening, lipid       Screen lipid panel today   Relevant Orders   Lipid panel        Follow up plan: Return in about 3 months (around 02/18/2022) for CPE.

## 2021-11-18 NOTE — Assessment & Plan Note (Signed)
She is currently smoking half a pack a day.  Encouraged complete tobacco cessation.

## 2021-11-18 NOTE — Assessment & Plan Note (Signed)
Chronic, stable.  She uses triamcinolone cream twice a day as needed.  Refill sent to the pharmacy.

## 2021-11-18 NOTE — Assessment & Plan Note (Signed)
BMI is 34.6.  She has lost 48 pounds over the last year, congratulated her on this.  She is interested in restarting phentermine.  PDMP reviewed.  We will have her start phentermine 37.5 mg daily for 3 months.  Follow-up with any side effects or concerns.

## 2021-11-18 NOTE — Patient Instructions (Signed)
It was great to see you!  We are checking your labs today and will let you know the results via mychart/phone.   I have refilled your medications.  I have placed a referral to an allergist.   Let's follow-up in 2-3 months, sooner if you have concerns.  If a referral was placed today, you will be contacted for an appointment. Please note that routine referrals can sometimes take up to 3-4 weeks to process. Please call our office if you haven't heard anything after this time frame.  Take care,  Vance Peper, NP

## 2021-11-18 NOTE — Assessment & Plan Note (Signed)
She has been having menorrhagia for several years.  She states that her menstrual period last for 7 days in the first 3-1/2 days are the worst.  She has done Depo-Provera injections and the birth control patch in the past, however wants to stay away from hormones.  Discussed that she can take ibuprofen or naproxen as needed for cramping and will prescribe Zofran 4 mg as needed for nausea.  Follow-up if symptoms worsen or with any concerns.

## 2022-01-06 ENCOUNTER — Ambulatory Visit: Payer: BC Managed Care – PPO | Admitting: Allergy

## 2022-02-11 NOTE — Progress Notes (Deleted)
New Patient Note  RE: Karen Jensen MRN: [id number] DOB: [birth year] Date of Office Visit: 02/12/2022  Consult requested by: Charyl Dancer, NP Primary care provider: Charyl Dancer, NP  Chief Complaint: No chief complaint on file.  History of Present Illness: I had the pleasure of seeing Karen Jensen for initial evaluation at the [facility] on 02/11/2022. She is a 37 y.o. female, who is referred here by Charyl Dancer, NP for the evaluation of lip swelling.  Swelling started about *** ago. Mainly occurs on her ***. Describes them as ***. Individual swelling episodes last about ***. No ecchymosis upon resolution. Associated symptoms include: ***.  Frequency of episodes: ***. Suspected triggers are ***. Denies any *** fevers, chills, changes in medications, foods, personal care products or recent infections. She has tried the following therapies: *** with *** benefit. Systemic steroids ***. Currently on ***.  Previous work up includes: ***. Previous history of swelling: {Blank single:19197::"yes","no"}. Family history of angioedema: {Blank single:19197::"yes","no"}. Patient is up to date with the following cancer screening tests: ***. Ace-inhibitor use: {Blank single:19197::"yes","no"}  11/18/2021 PCP visit: "She states that a few weeks ago she had swelling in her lip after eating tiger sauce. She states this lasted about a week. She has stopped eating this sauce and has not had any reactions since. She would like a referral for allergy testing. "  Assessment and Plan: Karen Jensen is a 37 y.o. female with: No problem-specific Assessment & Plan notes found for this encounter.  No follow-ups on file.  No orders of the defined types were placed in this encounter.  Lab Orders  No laboratory test(s) ordered today    Other allergy screening: Asthma: {Blank single:19197::"yes","no"} Rhino conjunctivitis: {Blank single:19197::"yes","no"} Food  allergy: {Blank single:19197::"yes","no"} Medication allergy: {Blank single:19197::"yes","no"} Hymenoptera allergy: {Blank single:19197::"yes","no"} Urticaria: {Blank single:19197::"yes","no"} Eczema:{Blank single:19197::"yes","no"} History of recurrent infections suggestive of immunodeficency: {Blank single:19197::"yes","no"}  Diagnostics: Spirometry:  Tracings reviewed. Her effort: {Blank single:19197::"Good reproducible efforts.","It was hard to get consistent efforts and there is a question as to whether this reflects a maximal maneuver.","Poor effort, data can not be interpreted."} FVC: ***L FEV1: ***L, ***% predicted FEV1/FVC ratio: ***% Interpretation: {Blank single:19197::"Spirometry consistent with mild obstructive disease","Spirometry consistent with moderate obstructive disease","Spirometry consistent with severe obstructive disease","Spirometry consistent with possible restrictive disease","Spirometry consistent with mixed obstructive and restrictive disease","Spirometry uninterpretable due to technique","Spirometry consistent with normal pattern","No overt abnormalities noted given today's efforts"}.  Please see scanned spirometry results for details.  Skin Testing: {Blank single:19197::"Select foods","Environmental allergy panel","Environmental allergy panel and select foods","Food allergy panel","None","Deferred due to recent antihistamines use"}. *** Results discussed with patient/family.   Past Medical History: Patient Active Problem List   Diagnosis Date Noted  . Obesity (BMI 30-39.9) 11/18/2021  . Eczema 11/18/2021  . Menorrhagia with regular cycle 11/18/2021  . Prediabetes 11/18/2021  . Lip swelling 11/18/2021  . Smoker 04/27/2019  . Anxiety 06/24/2018  . Chronic rhinitis 06/24/2018  . Migraine 01/24/2015  . Asthma, chronic, unspecified asthma severity, uncomplicated [id number]  . Carpal tunnel syndrome 01/24/2015   Past Medical History:  Diagnosis Date  .  Anxiety   . Arthritis   . BMI 45.0-49.9, adult ([place])   . Chronic asthma   . Chronic pain of right ankle   . Chronic rhinitis   . Eczema   . Migraines   . Prediabetes   . Smoker   . Snoring    Past Surgical History: Past Surgical History:  Procedure Laterality Date  . TYMPANOSTOMY  TUBE PLACEMENT Bilateral    Medication List:  Current Outpatient Medications  Medication Sig Dispense Refill  . Acetaminophen (TYLENOL ARTHRITIS PAIN PO) Take by mouth.    Marland Kitchen albuterol (PROVENTIL) (2.5 MG/3ML) 0.083% nebulizer solution Take 3 mLs (2.5 mg total) by nebulization every 6 (six) hours as needed for wheezing or shortness of breath. 150 mL 1  . albuterol (VENTOLIN HFA) 108 (90 Base) MCG/ACT inhaler Inhale 1-2 puffs into the lungs every 6 (six) hours as needed for wheezing or shortness of breath. 1 each 1  . fluticasone-salmeterol (ADVAIR DISKUS) 500-50 MCG/ACT AEPB Inhale 1 puff into the lungs in the morning and at bedtime. 60 each 5  . gabapentin (NEURONTIN) 300 MG capsule Take 1 capsule (300 mg total) by mouth daily. 90 capsule 1  . ondansetron (ZOFRAN) 4 MG tablet Take 1 tablet (4 mg total) by mouth every 8 (eight) hours as needed for nausea or vomiting. 30 tablet 1  . phentermine 37.5 MG capsule Take 1 capsule (37.5 mg total) by mouth every morning. 30 capsule 0  . triamcinolone cream (KENALOG) 0.1 % Apply 1 Application topically 2 (two) times daily. 453.6 g 2   No current facility-administered medications for this visit.   Allergies: Allergies  Allergen Reactions  . Other     WOOL produces rash   Social History: Social History   Socioeconomic History  . Marital status: Single    Spouse name: Not on file  . Number of children: 0  . Years of education: 12+  . Highest education level: Not on file  Occupational History  . Occupation: Psychologist, educational  Tobacco Use  . Smoking status: Every Day    Packs/day: 0.50    Years: 20.00    Total pack years: 10.00    Types: Cigarettes  .  Smokeless tobacco: Never  Vaping Use  . Vaping Use: Never used  Substance and Sexual Activity  . Alcohol use: No    Alcohol/week: 0.0 standard drinks of alcohol  . Drug use: Yes    Frequency: 7.0 times per week    Types: Marijuana  . Sexual activity: Yes    Partners: Female  Other Topics Concern  . Not on file  Social History Narrative  . Not on file   Social Determinants of Health   Financial Resource Strain: Not on file  Food Insecurity: Not on file  Transportation Needs: Not on file  Physical Activity: Not on file  Stress: Not on file  Social Connections: Not on file   Lives in a ***. Smoking: *** Occupation: ***  Environmental HistoryFreight forwarder in the house: Estate agent in the family room: {Blank single:19197::"yes","no"} Carpet in the bedroom: {Blank single:19197::"yes","no"} Heating: {Blank single:19197::"electric","gas","heat pump"} Cooling: {Blank single:19197::"central","window","heat pump"} Pet: {Blank single:19197::"yes ***","no"}  Family History: Family History  Problem Relation Age of Onset  . Thyroid disease Mother   . Eczema Sister   . Lupus Maternal Grandmother   . Heart disease Paternal Grandmother    Problem                               Relation Asthma                                   *** Eczema                                ***  Food allergy                          *** Allergic rhino conjunctivitis     ***  Review of Systems  Constitutional:  Negative for appetite change, chills, fever and unexpected weight change.  HENT:  Negative for congestion and rhinorrhea.   Eyes:  Negative for itching.  Respiratory:  Negative for cough, chest tightness, shortness of breath and wheezing.   Cardiovascular:  Negative for chest pain.  Gastrointestinal:  Negative for abdominal pain.  Genitourinary:  Negative for difficulty urinating.  Skin:  Negative for rash.  Neurological:  Negative for headaches.    Objective: There were no vitals taken for this visit. There is no height or weight on file to calculate BMI. Physical Exam Vitals and nursing note reviewed.  Constitutional:      Appearance: Normal appearance. She is well-developed.  HENT:     Head: Normocephalic and atraumatic.     Right Ear: Tympanic membrane and external ear normal.     Left Ear: Tympanic membrane and external ear normal.     Nose: Nose normal.     Mouth/Throat:     Mouth: Mucous membranes are moist.     Pharynx: Oropharynx is clear.  Eyes:     Conjunctiva/sclera: Conjunctivae normal.  Cardiovascular:     Rate and Rhythm: Normal rate and regular rhythm.     Heart sounds: Normal heart sounds. No murmur heard.    No friction rub. No gallop.  Pulmonary:     Effort: Pulmonary effort is normal.     Breath sounds: Normal breath sounds. No wheezing, rhonchi or rales.  Musculoskeletal:     Cervical back: Neck supple.  Skin:    General: Skin is warm.     Findings: No rash.  Neurological:     Mental Status: She is alert and oriented to person, place, and time.  Psychiatric:        Behavior: Behavior normal.  The plan was reviewed with the patient/family, and all questions/concerned were addressed.  It was my pleasure to see Karen Jensen today and participate in her care. Please feel free to contact me with any questions or concerns.  Sincerely,  Rexene Alberts, DO Allergy & Immunology  [facility] office: [place] office: [phone]

## 2022-02-12 ENCOUNTER — Ambulatory Visit: Payer: BC Managed Care – PPO | Admitting: Allergy

## 2022-02-14 NOTE — Progress Notes (Unsigned)
There were no vitals taken for this visit.   Subjective:    Patient ID: Karen Jensen, female    DOB: January 20, 1986, 37 y.o.   MRN: [id number]  CC: No chief complaint on file.   HPI: Karen Jensen is a 37 y.o. female presenting on 02/18/2022 for comprehensive medical examination. Current medical complaints include:{Blank single:19197::"none","***"}  She currently lives with: Menopausal Symptoms: {Blank single:19197::"yes","no"}  Depression Screen done today and results listed below:     11/18/2021    9:56 AM 01/29/2021    8:52 AM 10/24/2020    9:20 AM 10/24/2020    9:10 AM 03/02/2020    8:01 AM  Depression screen PHQ 2/9  Decreased Interest  2 1 0 0  Down, Depressed, Hopeless 0 2 0 0 0  PHQ - 2 Score 0 4 1 0 0  Altered sleeping 1 2 3  0   Tired, decreased energy 1 0 3 0   Change in appetite 2  3 0   Feeling bad or failure about yourself  0 2 0 0   Trouble concentrating 1 0 2 0   Moving slowly or fidgety/restless 0 0 1 0   Suicidal thoughts 0 0 0 0   PHQ-9 Score 5 8 13  0   Difficult doing work/chores Somewhat difficult Not difficult at all Somewhat difficult Not difficult at all     The patient {has/does not have:19849} a history of falls. I {did/did not:19850} complete a risk assessment for falls. A plan of care for falls {was/was not:19852} documented.   Past Medical History:  Past Medical History:  Diagnosis Date   Anxiety    Arthritis    BMI 45.0-49.9, adult (HCC)    Chronic asthma    Chronic pain of right ankle    Chronic rhinitis    Eczema    Migraines    Prediabetes    Smoker    Snoring     Surgical History:  Past Surgical History:  Procedure Laterality Date   TYMPANOSTOMY TUBE PLACEMENT Bilateral     Medications:  Current Outpatient Medications on File Prior to Visit  Medication Sig   Acetaminophen (TYLENOL ARTHRITIS PAIN PO) Take by mouth.   albuterol (PROVENTIL) (2.5 MG/3ML) 0.083% nebulizer solution Take 3 mLs (2.5 mg total) by  nebulization every 6 (six) hours as needed for wheezing or shortness of breath.   albuterol (VENTOLIN HFA) 108 (90 Base) MCG/ACT inhaler Inhale 1-2 puffs into the lungs every 6 (six) hours as needed for wheezing or shortness of breath.   fluticasone-salmeterol (ADVAIR DISKUS) 500-50 MCG/ACT AEPB Inhale 1 puff into the lungs in the morning and at bedtime.   gabapentin (NEURONTIN) 300 MG capsule Take 1 capsule (300 mg total) by mouth daily.   ondansetron (ZOFRAN) 4 MG tablet Take 1 tablet (4 mg total) by mouth every 8 (eight) hours as needed for nausea or vomiting.   phentermine 37.5 MG capsule Take 1 capsule (37.5 mg total) by mouth every morning.   triamcinolone cream (KENALOG) 0.1 % Apply 1 Application topically 2 (two) times daily.   No current facility-administered medications on file prior to visit.    Allergies:  Allergies  Allergen Reactions   Other     WOOL produces rash    Social History:  Social History   Socioeconomic History   Marital status: Single    Spouse name: Not on file   Number of children: 0   Years of education: 12+   Highest education level: Not  on file  Occupational History   Occupation: Set designer  Tobacco Use   Smoking status: Every Day    Packs/day: 0.50    Years: 20.00    Total pack years: 10.00    Types: Cigarettes   Smokeless tobacco: Never  Vaping Use   Vaping Use: Never used  Substance and Sexual Activity   Alcohol use: No    Alcohol/week: 0.0 standard drinks of alcohol   Drug use: Yes    Frequency: 7.0 times per week    Types: Marijuana   Sexual activity: Yes    Partners: Female  Other Topics Concern   Not on file  Social History Narrative   Not on file   Social Determinants of Health   Financial Resource Strain: Not on file  Food Insecurity: Not on file  Transportation Needs: Not on file  Physical Activity: Not on file  Stress: Not on file  Social Connections: Not on file  Intimate Partner Violence: Not on file   Social  History   Tobacco Use  Smoking Status Every Day   Packs/day: 0.50   Years: 20.00   Total pack years: 10.00   Types: Cigarettes  Smokeless Tobacco Never   Social History   Substance and Sexual Activity  Alcohol Use No   Alcohol/week: 0.0 standard drinks of alcohol    Family History:  Family History  Problem Relation Age of Onset   Thyroid disease Mother    Eczema Sister    Lupus Maternal Grandmother    Heart disease Paternal Grandmother     Past medical history, surgical history, medications, allergies, family history and social history reviewed with patient today and changes made to appropriate areas of the chart.   ROS All other ROS negative except what is listed above and in the HPI.      Objective:    There were no vitals taken for this visit.  Wt Readings from Last 3 Encounters:  11/18/21 177 lb 3.2 oz (80.4 kg)  01/29/21 197 lb 3.2 oz (89.4 kg)  10/29/20 225 lb (102.1 kg)    Physical Exam  Results for orders placed or performed in visit on 11/18/21  CBC  Result Value Ref Range   WBC 7.9 4.0 - 10.5 K/uL   RBC 4.40 3.87 - 5.11 Mil/uL   Platelets 296.0 150.0 - 400.0 K/uL   Hemoglobin 13.3 12.0 - 15.0 g/dL   HCT 25.4 27.0 - 62.3 %   MCV 91.1 78.0 - 100.0 fl   MCHC 33.2 30.0 - 36.0 g/dL   RDW 76.2 83.1 - 51.7 %  Comprehensive metabolic panel  Result Value Ref Range   Sodium 138 135 - 145 mEq/L   Potassium 4.0 3.5 - 5.1 mEq/L   Chloride 108 96 - 112 mEq/L   CO2 22 19 - 32 mEq/L   Glucose, Bld 100 (H) 70 - 99 mg/dL   BUN 12 6 - 23 mg/dL   Creatinine, Ser 6.16 0.40 - 1.20 mg/dL   Total Bilirubin 0.9 0.2 - 1.2 mg/dL   Alkaline Phosphatase 58 39 - 117 U/L   AST 15 0 - 37 U/L   ALT 20 0 - 35 U/L   Total Protein 6.5 6.0 - 8.3 g/dL   Albumin 4.1 3.5 - 5.2 g/dL   GFR 073.71 >06.26 mL/min   Calcium 9.3 8.4 - 10.5 mg/dL  Lipid panel  Result Value Ref Range   Cholesterol 158 0 - 200 mg/dL   Triglycerides 948.5 (H) 0.0 - 149.0 mg/dL  HDL 44.40 >39.00  mg/dL   VLDL 30.4 0.0 - 40.0 mg/dL   LDL Cholesterol 83 0 - 99 mg/dL   Total CHOL/HDL Ratio 4    NonHDL 113.25   Hemoglobin A1c  Result Value Ref Range   Hgb A1c MFr Bld 5.8 4.6 - 6.5 %      Assessment & Plan:   Problem List Items Addressed This Visit   None    Follow up plan: No follow-ups on file.   LABORATORY TESTING:  - Pap smear: {Blank OACZYS:06301::"SWF done","not applicable","up to date","done elsewhere"}  IMMUNIZATIONS:   - Tdap: Tetanus vaccination status reviewed: last tetanus booster within 10 years. - Influenza: Refused - Pneumovax: Not applicable - Prevnar: Refused - HPV: Not applicable - Zostavax vaccine: Not applicable  SCREENING: -Mammogram: Not applicable  - Colonoscopy: Not applicable  - Bone Density: Not applicable  -Hearing Test: Not applicable  -Spirometry: Not applicable   PATIENT COUNSELING:   Advised to take 1 mg of folate supplement per day if capable of pregnancy.   Sexuality: Discussed sexually transmitted diseases, partner selection, use of condoms, avoidance of unintended pregnancy  and contraceptive alternatives.   Advised to avoid cigarette smoking.  I discussed with the patient that most people either abstain from alcohol or drink within safe limits (<=14/week and <=4 drinks/occasion for males, <=7/weeks and <= 3 drinks/occasion for females) and that the risk for alcohol disorders and other health effects rises proportionally with the number of drinks per week and how often a drinker exceeds daily limits.  Discussed cessation/primary prevention of drug use and availability of treatment for abuse.   Diet: Encouraged to adjust caloric intake to maintain  or achieve ideal body weight, to reduce intake of dietary saturated fat and total fat, to limit sodium intake by avoiding high sodium foods and not adding table salt, and to maintain adequate dietary potassium and calcium preferably from fresh fruits, vegetables, and low-fat dairy  products.    stressed the importance of regular exercise  Injury prevention: Discussed safety belts, safety helmets, smoke detector, smoking near bedding or upholstery.   Dental health: Discussed importance of regular tooth brushing, flossing, and dental visits.    NEXT PREVENTATIVE PHYSICAL DUE IN 1 YEAR. No follow-ups on file.

## 2022-02-18 ENCOUNTER — Encounter: Payer: Self-pay | Admitting: Nurse Practitioner

## 2022-02-18 ENCOUNTER — Other Ambulatory Visit (HOSPITAL_COMMUNITY)
Admission: RE | Admit: 2022-02-18 | Discharge: 2022-02-18 | Disposition: A | Discharge status: Home / Self Care | Payer: BC Managed Care – PPO | Source: Ambulatory Visit | Attending: Nurse Practitioner | Admitting: Nurse Practitioner

## 2022-02-18 ENCOUNTER — Ambulatory Visit (INDEPENDENT_AMBULATORY_CARE_PROVIDER_SITE_OTHER): Payer: BC Managed Care – PPO | Admitting: Nurse Practitioner

## 2022-02-18 VITALS — BP 112/84 | HR 82 | Temp 97.6°F | Ht 60.0 in | Wt 181.0 lb

## 2022-02-18 DIAGNOSIS — E669 Obesity, unspecified: Secondary | ICD-10-CM

## 2022-02-18 DIAGNOSIS — Z124 Encounter for screening for malignant neoplasm of cervix: Secondary | ICD-10-CM

## 2022-02-18 DIAGNOSIS — J45909 Unspecified asthma, uncomplicated: Secondary | ICD-10-CM

## 2022-02-18 DIAGNOSIS — K5900 Constipation, unspecified: Secondary | ICD-10-CM | POA: Diagnosis not present

## 2022-02-18 DIAGNOSIS — Z0001 Encounter for general adult medical examination with abnormal findings: Secondary | ICD-10-CM

## 2022-02-18 DIAGNOSIS — Z0000 Encounter for general adult medical examination without abnormal findings: Secondary | ICD-10-CM

## 2022-02-18 MED ORDER — ALBUTEROL SULFATE (2.5 MG/3ML) 0.083% IN NEBU: 2.5000 mg | INHALATION_SOLUTION | Freq: Four times a day (QID) | RESPIRATORY_TRACT | 1 refills | Status: AC | PRN

## 2022-02-18 MED ORDER — PHENTERMINE HCL 37.5 MG PO CAPS: 37.5000 mg | ORAL_CAPSULE | ORAL | 0 refills | Status: DC

## 2022-02-18 MED ORDER — FLUTICASONE-SALMETEROL 500-50 MCG/ACT IN AEPB: 1.0000 | INHALATION_SPRAY | Freq: Two times a day (BID) | RESPIRATORY_TRACT | 3 refills | Status: DC

## 2022-02-18 MED ORDER — POLYETHYLENE GLYCOL 3350 17 GM/SCOOP PO POWD: 17.0000 g | Freq: Two times a day (BID) | ORAL | 0 refills | Status: DC | PRN

## 2022-02-18 NOTE — Patient Instructions (Signed)
It was great to see you!  Start miralax powder twice a day. Mix this in a cup of water. You can cut back once you start having bowel movements.   Switch your advair inhaler to Wixela inhaler twice a day. Keep using the albuterol nebulizer or inhaler as needed inbetween Wixela doses.   Let's follow-up in 4-6 weeks, sooner if you have concerns.  If a referral was placed today, you will be contacted for an appointment. Please note that routine referrals can sometimes take up to 3-4 weeks to process. Please call our office if you haven't heard anything after this time frame.  Take care,  Vance Peper, NP

## 2022-02-18 NOTE — Assessment & Plan Note (Signed)
She states that she does not feel like her asthma is fully controlled.  She still gets short of breath, especially at work working in a [employer].  Will have her switch her Advair to [facility] inhaler twice a day.  She can continue to use albuterol inhaler or nebulizer as needed.  Follow-up in 4 to 6 weeks.  If still having trouble with asthma, will refer to pulmonology.

## 2022-02-18 NOTE — Assessment & Plan Note (Signed)
BMI 35.5.  She would like to continue the phentermine.  PDMP reviewed.  Refill sent to the pharmacy.  Follow-up in 4 to 6 weeks.

## 2022-02-20 LAB — CYTOLOGY - PAP
Adequacy: ABSENT
Comment: NEGATIVE
Diagnosis: NEGATIVE
High risk HPV: NEGATIVE

## 2022-03-20 ENCOUNTER — Telehealth: Payer: Self-pay | Admitting: Nurse Practitioner

## 2022-03-20 ENCOUNTER — Ambulatory Visit: Payer: BC Managed Care – PPO | Admitting: Nurse Practitioner

## 2022-03-20 NOTE — Telephone Encounter (Signed)
Pt was a no show for an OV with Lauren on 03/20/22, I sent a letter.

## 2022-03-31 NOTE — Telephone Encounter (Signed)
Noted  

## 2022-03-31 NOTE — Telephone Encounter (Signed)
1st no show, fee waived, letter sent to reschedule/notify of policy 

## 2022-06-01 IMAGING — CR DG ANKLE COMPLETE 3+V*R*
3 series · 3 of 3 positions shown · non-contrast
Comparison: 10/28/2017.

CLINICAL DATA: Ankle pain.  No known injury.

EXAM:
RIGHT ANKLE - COMPLETE 3+ VIEW

[x ankle ap right]
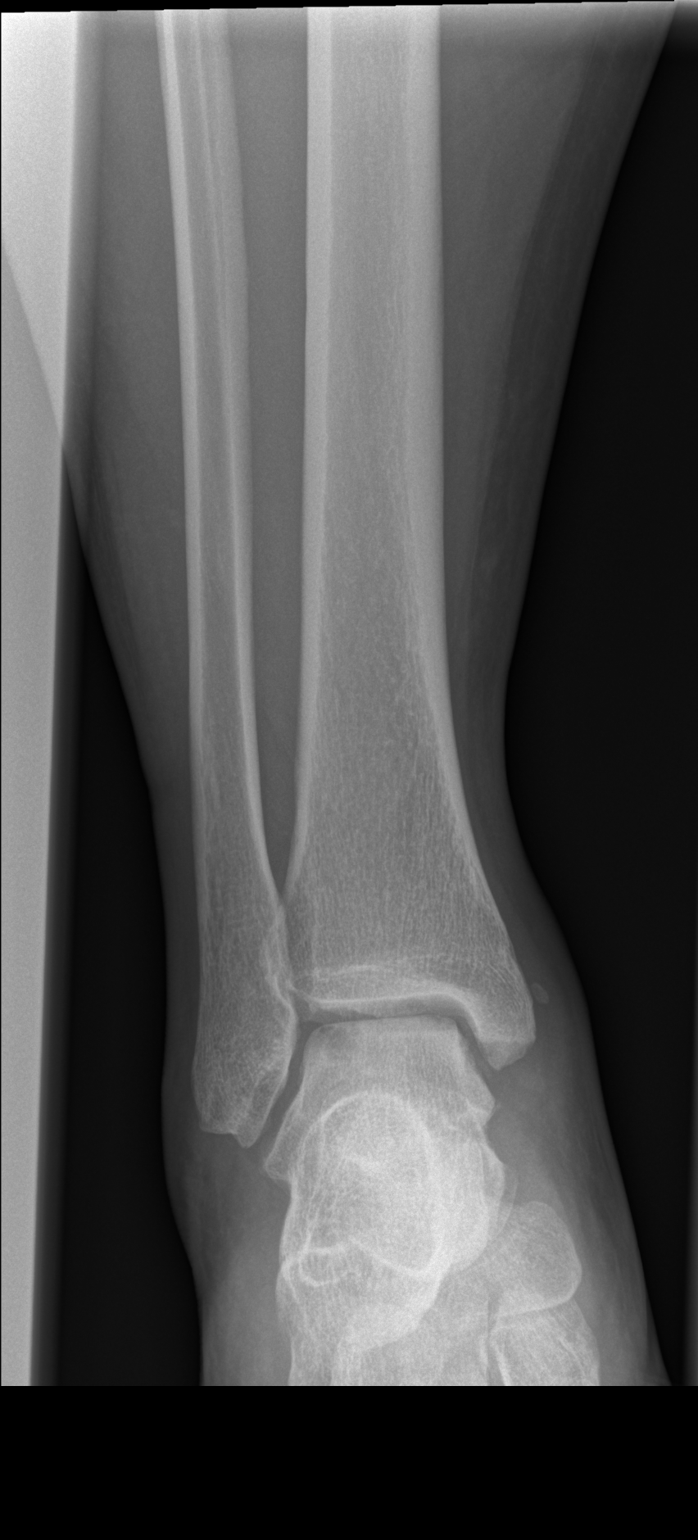

[x ankle obl right]
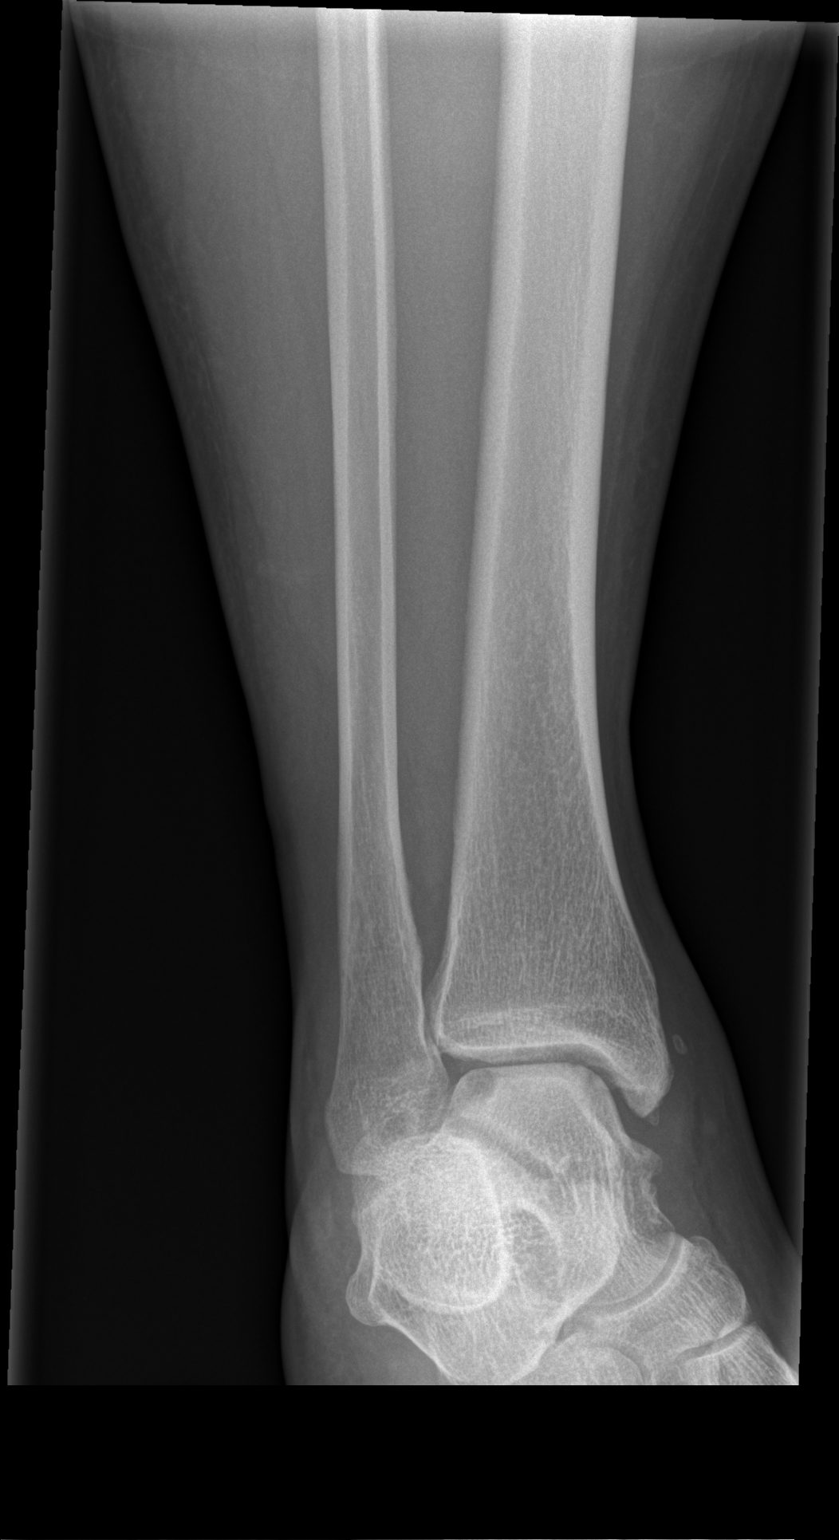

[x ankle lat right]
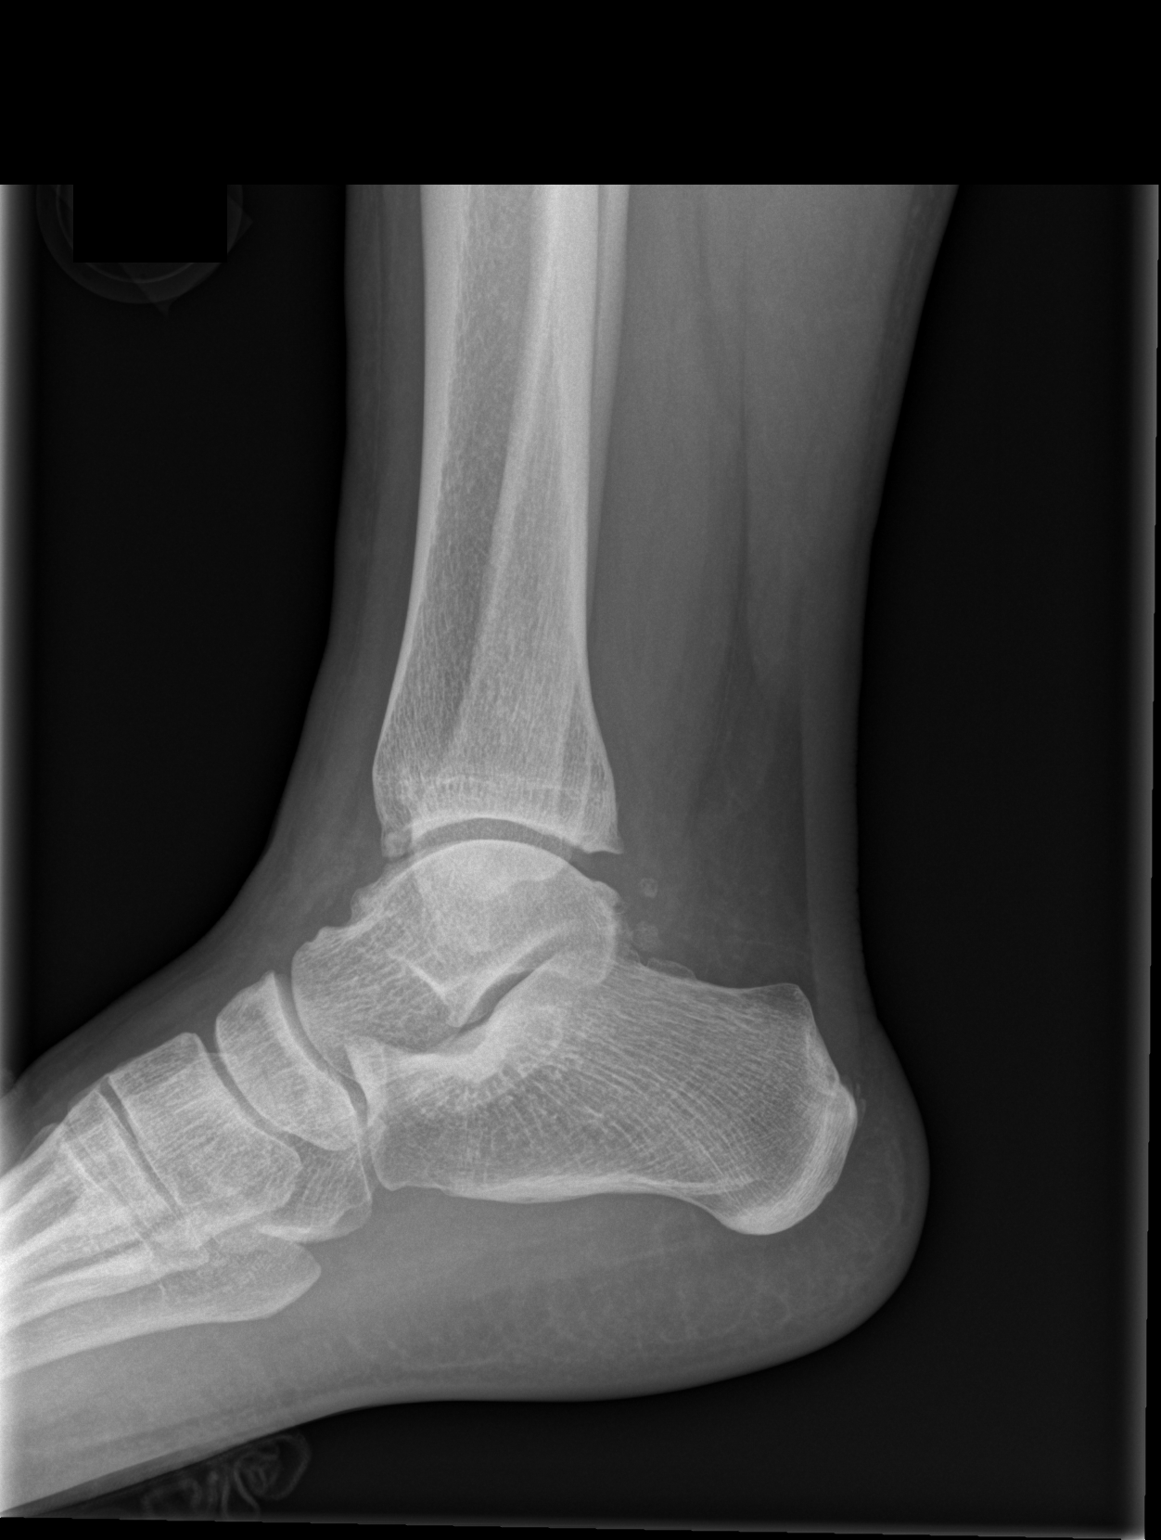

[3 of 3 positions shown; findings below may reference images not displayed]

FINDINGS: Diffuse soft tissue swelling. Diffuse degenerative change. Tiny
corticated bony densities noted posteriorly and medially most likely
old tiny fracture fragments. No acute abnormality identified. No
radiopaque foreign body.
IMPRESSION: Diffuse soft tissue swelling. Diffuse degenerative change. Tiny
corticated bony densities noted posteriorly and medially most likely
old tiny fracture fragments. No acute abnormality.

## 2022-07-03 ENCOUNTER — Telehealth: Payer: Self-pay | Admitting: Nurse Practitioner

## 2022-07-03 NOTE — Telephone Encounter (Signed)
Noted  

## 2022-07-03 NOTE — Telephone Encounter (Signed)
Pt would like a call when you receive the forms from her job and they are ready.

## 2022-07-07 NOTE — Telephone Encounter (Signed)
   CLINICAL USE BELOW THIS LINE (use X to signify action taken)  _X__ Form received and placed in providers office for signature. ___ Form completed and faxed to LOA Dept.  ___ Form completed & LVM to notify patient ready for pick up.  ___ Charge sheet and copy of form in front office folder for office supervisor.  

## 2022-07-08 NOTE — Telephone Encounter (Signed)
I called and spoke with Luisa Hart and he said that patient has a history of carpal tunnel and needs a basic return to work form completed with normal work with out restrictions. He said patient was seen at an urgent care July 7th, 2023.

## 2022-07-08 NOTE — Telephone Encounter (Signed)
LVM that paper work completed and faxed and will leave a copy upfront for pick up.

## 2023-01-09 ENCOUNTER — Other Ambulatory Visit: Payer: Self-pay | Admitting: Nurse Practitioner

## 2023-01-09 MED ORDER — FLUTICASONE-SALMETEROL 500-50 MCG/ACT IN AEPB: 1.0000 | INHALATION_SPRAY | Freq: Two times a day (BID) | RESPIRATORY_TRACT | 3 refills | Status: DC

## 2023-01-09 NOTE — Telephone Encounter (Signed)
Copied from CRM [id number]. Topic: Clinical - Medication Refill >> Jan 09, 2023  9:20 AM Marica Otter wrote: Most Recent Primary Care Visit:  Provider: Rodman Pickle A  Department: [facility]  Visit Type: PHYSICAL  Date: 02/18/2022  Medication: fluticasone-salmeterol (WIXELA INHUB) 500-50 MCG/ACT AEPB  Has the patient contacted their pharmacy? Yes, refill expired (Agent: If no, request that the patient contact the pharmacy for the refill. If patient does not wish to contact the pharmacy document the reason why and proceed with request.) (Agent: If yes, when and what did the pharmacy advise?)  Is this the correct pharmacy for this prescription?  If no, delete pharmacy and type the correct one.  This is the patient's preferred pharmacy:  [facility] [address],  - [address] [address] Phone: [phone] Fax: [phone]   Has the prescription been filled recently? No  Is the patient out of the medication? Yes  Has the patient been seen for an appointment in the last year OR does the patient have an upcoming appointment?   Can we respond through MyChart?   Agent: Please be advised that Rx refills may take up to 3 business days. We ask that you follow-up with your pharmacy.

## 2023-01-23 ENCOUNTER — Ambulatory Visit: Payer: BC Managed Care – PPO | Admitting: Nurse Practitioner

## 2023-04-27 ENCOUNTER — Other Ambulatory Visit (HOSPITAL_COMMUNITY): Payer: Self-pay

## 2023-04-27 ENCOUNTER — Ambulatory Visit (INDEPENDENT_AMBULATORY_CARE_PROVIDER_SITE_OTHER): Payer: Self-pay | Admitting: Nurse Practitioner

## 2023-04-27 ENCOUNTER — Telehealth: Payer: Self-pay

## 2023-04-27 ENCOUNTER — Encounter: Payer: Self-pay | Admitting: Nurse Practitioner

## 2023-04-27 VITALS — BP 122/86 | HR 87 | Temp 97.6°F | Ht 60.0 in | Wt 231.8 lb

## 2023-04-27 DIAGNOSIS — J45909 Unspecified asthma, uncomplicated: Secondary | ICD-10-CM | POA: Diagnosis not present

## 2023-04-27 DIAGNOSIS — R7303 Prediabetes: Secondary | ICD-10-CM | POA: Diagnosis not present

## 2023-04-27 DIAGNOSIS — F17200 Nicotine dependence, unspecified, uncomplicated: Secondary | ICD-10-CM | POA: Diagnosis not present

## 2023-04-27 DIAGNOSIS — G5603 Carpal tunnel syndrome, bilateral upper limbs: Secondary | ICD-10-CM

## 2023-04-27 DIAGNOSIS — H6993 Unspecified Eustachian tube disorder, bilateral: Secondary | ICD-10-CM

## 2023-04-27 DIAGNOSIS — Z0000 Encounter for general adult medical examination without abnormal findings: Secondary | ICD-10-CM

## 2023-04-27 DIAGNOSIS — R0683 Snoring: Secondary | ICD-10-CM

## 2023-04-27 DIAGNOSIS — L309 Dermatitis, unspecified: Secondary | ICD-10-CM

## 2023-04-27 DIAGNOSIS — E119 Type 2 diabetes mellitus without complications: Secondary | ICD-10-CM

## 2023-04-27 LAB — LIPID PANEL
Cholesterol: 169 mg/dL (ref 0–200)
HDL: 36.6 mg/dL — ABNORMAL LOW (ref >39.00)
LDL Cholesterol: 96 mg/dL (ref 0–99)
NonHDL: 132.43
Total CHOL/HDL Ratio: 5
Triglycerides: 181 mg/dL — ABNORMAL HIGH (ref 0.0–149.0)
VLDL: 36.2 mg/dL (ref 0.0–40.0)

## 2023-04-27 LAB — CBC WITH DIFFERENTIAL/PLATELET
Basophils Absolute: 0 10*3/uL (ref 0.0–0.1)
Basophils Relative: 0.4 % (ref 0.0–3.0)
Eosinophils Absolute: 0.2 10*3/uL (ref 0.0–0.7)
Eosinophils Relative: 2.1 % (ref 0.0–5.0)
HCT: 42.4 % (ref 36.0–46.0)
Hemoglobin: 14.3 g/dL (ref 12.0–15.0)
Lymphocytes Relative: 22.2 % (ref 12.0–46.0)
Lymphs Abs: 2.4 10*3/uL (ref 0.7–4.0)
MCHC: 33.7 g/dL (ref 30.0–36.0)
MCV: 89.4 fl (ref 78.0–100.0)
Monocytes Absolute: 0.7 10*3/uL (ref 0.1–1.0)
Monocytes Relative: 6.5 % (ref 3.0–12.0)
Neutro Abs: 7.4 10*3/uL (ref 1.4–7.7)
Neutrophils Relative %: 68.8 % (ref 43.0–77.0)
Platelets: 337 10*3/uL (ref 150.0–400.0)
RBC: 4.74 Mil/uL (ref 3.87–5.11)
RDW: 13.7 % (ref 11.5–15.5)
WBC: 10.7 10*3/uL — ABNORMAL HIGH (ref 4.0–10.5)

## 2023-04-27 LAB — COMPREHENSIVE METABOLIC PANEL WITH GFR
ALT: 18 U/L (ref 0–35)
AST: 12 U/L (ref 0–37)
Albumin: 4.4 g/dL (ref 3.5–5.2)
Alkaline Phosphatase: 72 U/L (ref 39–117)
BUN: 12 mg/dL (ref 6–23)
CO2: 23 meq/L (ref 19–32)
Calcium: 9.3 mg/dL (ref 8.4–10.5)
Chloride: 106 meq/L (ref 96–112)
Creatinine, Ser: 0.79 mg/dL (ref 0.40–1.20)
GFR: 96.52 mL/min (ref >60.00)
Glucose, Bld: 125 mg/dL — ABNORMAL HIGH (ref 70–99)
Potassium: 4.1 meq/L (ref 3.5–5.1)
Sodium: 139 meq/L (ref 135–145)
Total Bilirubin: 0.6 mg/dL (ref 0.2–1.2)
Total Protein: 7.5 g/dL (ref 6.0–8.3)

## 2023-04-27 LAB — HEMOGLOBIN A1C: Hgb A1c MFr Bld: 6.6 % — ABNORMAL HIGH (ref 4.6–6.5)

## 2023-04-27 MED ORDER — PREDNISONE 20 MG PO TABS: 40.0000 mg | ORAL_TABLET | Freq: Every day | ORAL | 0 refills | Status: DC

## 2023-04-27 MED ORDER — OZEMPIC (0.25 OR 0.5 MG/DOSE) 2 MG/1.5ML ~~LOC~~ SOPN
0.2500 mg | PEN_INJECTOR | SUBCUTANEOUS | 0 refills | Status: DC
Start: 2023-04-27 — End: 2023-05-20

## 2023-04-27 MED ORDER — ALBUTEROL SULFATE HFA 108 (90 BASE) MCG/ACT IN AERS: 1.0000 | INHALATION_SPRAY | Freq: Four times a day (QID) | RESPIRATORY_TRACT | 1 refills | Status: AC | PRN

## 2023-04-27 MED ORDER — ZEPBOUND 2.5 MG/0.5ML ~~LOC~~ SOAJ: 2.5000 mg | SUBCUTANEOUS | 1 refills | Status: DC

## 2023-04-27 MED ORDER — TRIAMCINOLONE ACETONIDE 0.1 % EX CREA: 1.0000 | TOPICAL_CREAM | Freq: Two times a day (BID) | CUTANEOUS | 2 refills | Status: DC

## 2023-04-27 MED ORDER — LISINOPRIL 5 MG PO TABS: 5.0000 mg | ORAL_TABLET | Freq: Every day | ORAL | 0 refills | Status: DC

## 2023-04-27 NOTE — Assessment & Plan Note (Signed)
 Chronic, stable.  She is taking gabapentin 300 mg daily, wears a wrist brace as needed.  Follow-up as symptoms worsen or any concerns.

## 2023-04-27 NOTE — Assessment & Plan Note (Signed)
 Flares occur in summer, and she uses triamcinolone cream frequently. Refill triamcinolone cream in a jar.

## 2023-04-27 NOTE — Telephone Encounter (Signed)
 Pharmacy Patient Advocate Encounter   Received notification from CoverMyMeds that prior authorization for Ozempic (0.25 or 0.5 MG/DOSE) 2MG /3ML pen-injectors is required/requested.   Insurance verification completed.   The patient is insured through [employer] .   Per test claim: PA required; PA submitted to above mentioned insurance via CoverMyMeds Key/confirmation #/EOC (Key: [id number])   Status is pending

## 2023-04-27 NOTE — Assessment & Plan Note (Signed)
Health maintenance reviewed and updated. Discussed nutrition, exercise. Follow-up 1 year.

## 2023-04-27 NOTE — Assessment & Plan Note (Addendum)
 Weight gain occurred after stopping phentermine. She is interested in Zepbound, which may be covered by insurance and can aid in weight loss and sleep apnea treatment. Side effects include nausea, upset stomach, and constipation. Start Zepbound once weekly if covered by insurance. Check blood sugar levels to rule out diabetes. Educated on potential side effects of Zepbound and advised on lifestyle modifications, including smaller, more frequent meals and adequate hydration. Check lipid panel today. Follow-up in 4-6 weeks.

## 2023-04-27 NOTE — Addendum Note (Signed)
 Addended by: Rodman Pickle A on: 04/27/2023 12:56 PM   Modules accepted: Orders

## 2023-04-27 NOTE — Telephone Encounter (Signed)
 Please note if you're Initiating A Prior Authorization in the future for this patient then please note that her name is misspelled in [employer] as ''[facility]''. The patients correct lastname is ''[facility]''

## 2023-04-27 NOTE — Patient Instructions (Signed)
 It was great to see you!  We are checking your labs today and will let you know the results via mychart/phone.   Start zepbound injection once a week. Make sure you are drinking plenty of water   I have placed a referral to sleep medicine for a sleep study.   Let's follow-up in 4-6 weeks, sooner if you have concerns.  If a referral was placed today, you will be contacted for an appointment. Please note that routine referrals can sometimes take up to 3-4 weeks to process. Please call our office if you haven't heard anything after this time frame.  Take care,  Rodman Pickle, NP

## 2023-04-27 NOTE — Assessment & Plan Note (Signed)
 Chronic, stable. Asthma is well-controlled with Wixela, with infrequent use of the rescue inhaler. Refill the albuterol inhaler and continue Wixela as maintenance therapy. Declines pneumonia vaccine today.

## 2023-04-27 NOTE — Assessment & Plan Note (Signed)
 She has cut back on her smoking, but is still smoking marijuana.  Encouraged complete tobacco cessation.

## 2023-04-27 NOTE — Progress Notes (Signed)
 BP 122/86 (BP Location: Left Arm, Patient Position: Sitting, Cuff Size: Normal)   Pulse 87   Temp 97.6 F (36.4 C)   Ht 5' (1.524 m)   Wt 231 lb 12.8 oz (105.1 kg)   LMP 04/18/2023 (Exact Date)   SpO2 97%   BMI 45.27 kg/m    Subjective:    Patient ID: Karen Jensen, female    DOB: 1985/07/25, 38 y.o.   MRN: [id number]  CC: Chief Complaint  Patient presents with   Annual Exam    With lab work-patient is not fasting, weight concerns, sleep concerns, Rx Refills, ears feel stopped up    HPI: Karen Jensen is a 38 y.o. female presenting on 04/27/2023 for comprehensive medical examination. Current medical complaints include: weight, sleep  Discussed the use of AI scribe software for clinical note transcription with the patient, who gave verbal consent to proceed.  History of Present Illness   The patient, with a history of asthma and previous use of phentermine for weight loss, presents for a physical. The patient reports recent weight gain after discontinuing phentermine due to sleep disturbances. The patient describes sleeping for only four to five hours at night, waking up, and then going back to sleep. The patient also reports snoring and has been told she stops breathing during sleep. The patient has not previously been tested for sleep apnea.  The patient's asthma is reportedly well-managed with the use of Wixela, and the patient has not needed to use a rescue inhaler. However, the patient reports feeling like her ears are clogged and draining slowly. The patient also mentions occasional skin flare-ups of eczema, particularly in the summer.      Depression and Anxiety Screen done today and results listed below:     04/27/2023    9:33 AM 11/18/2021    9:56 AM 01/29/2021    8:52 AM 10/24/2020    9:20 AM 10/24/2020    9:10 AM  Depression screen PHQ 2/9  Decreased Interest 3  2 1  0  Down, Depressed, Hopeless 1 0 2 0 0  PHQ - 2 Score 4 0 4 1 0  Altered sleeping 3 1 2  3  0  Tired, decreased energy 3 1 0 3 0  Change in appetite 3 2  3  0  Feeling bad or failure about yourself  3 0 2 0 0  Trouble concentrating 1 1 0 2 0  Moving slowly or fidgety/restless 1 0 0 1 0  Suicidal thoughts 0 0 0 0 0  PHQ-9 Score 18 5 8 13  0  Difficult doing work/chores Somewhat difficult Somewhat difficult Not difficult at all Somewhat difficult Not difficult at all      04/27/2023    9:33 AM 11/18/2021    9:56 AM  GAD 7 : Generalized Anxiety Score  Nervous, Anxious, on Edge 1 0  Control/stop worrying 1 0  Worry too much - different things 1 0  Trouble relaxing 2 0  Restless 1 0  Easily annoyed or irritable 3 2  Afraid - awful might happen 3 1  Total GAD 7 Score 12 3  Anxiety Difficulty Very difficult Somewhat difficult    The patient does not have a history of falls. I did not complete a risk assessment for falls. A plan of care for falls was not documented.   Past Medical History:  Past Medical History:  Diagnosis Date   Anxiety    Arthritis    BMI 45.0-49.9, adult (HCC)  Chronic asthma    Chronic pain of right ankle    Chronic rhinitis    Eczema    Migraines    Prediabetes    Smoker    Snoring     Surgical History:  Past Surgical History:  Procedure Laterality Date   TYMPANOSTOMY TUBE PLACEMENT Bilateral     Medications:  Current Outpatient Medications on File Prior to Visit  Medication Sig   albuterol (PROVENTIL) (2.5 MG/3ML) 0.083% nebulizer solution Take 3 mLs (2.5 mg total) by nebulization every 6 (six) hours as needed for wheezing or shortness of breath.   fluticasone-salmeterol (WIXELA INHUB) 500-50 MCG/ACT AEPB Inhale 1 puff into the lungs in the morning and at bedtime.   gabapentin (NEURONTIN) 300 MG capsule Take 1 capsule (300 mg total) by mouth daily.   Acetaminophen (TYLENOL ARTHRITIS PAIN PO) Take by mouth. (Patient not taking: Reported on 04/27/2023)   No current facility-administered medications on file prior to visit.     Allergies:  Allergies  Allergen Reactions   Other     WOOL produces rash    Social History:  Social History   Socioeconomic History   Marital status: Single    Spouse name: Not on file   Number of children: 0   Years of education: 12+   Highest education level: Not on file  Occupational History   Occupation: Set designer  Tobacco Use   Smoking status: Every Day    Current packs/day: 0.50    Average packs/day: 0.5 packs/day for 20.0 years (10.0 ttl pk-yrs)    Types: Cigarettes   Smokeless tobacco: Never  Vaping Use   Vaping status: Never Used  Substance and Sexual Activity   Alcohol use: No    Alcohol/week: 0.0 standard drinks of alcohol   Drug use: Yes    Frequency: 7.0 times per week    Types: Marijuana   Sexual activity: Yes    Partners: Female  Other Topics Concern   Not on file  Social History Narrative   Not on file   Social Drivers of Health   Financial Resource Strain: Not on file  Food Insecurity: Not on file  Transportation Needs: Not on file  Physical Activity: Not on file  Stress: Not on file (11/27/2022)  Social Connections: Unknown (06/03/2021)   Received from [facility]   Social Network    Social Network: Not on file  Intimate Partner Violence: Unknown (04/25/2021)   Received from [facility]   HITS    Physically Hurt: Not on file    Insult or Talk Down To: Not on file    Threaten Physical Harm: Not on file    Scream or Curse: Not on file   Social History   Tobacco Use  Smoking Status Every Day   Current packs/day: 0.50   Average packs/day: 0.5 packs/day for 20.0 years (10.0 ttl pk-yrs)   Types: Cigarettes  Smokeless Tobacco Never   Social History   Substance and Sexual Activity  Alcohol Use No   Alcohol/week: 0.0 standard drinks of alcohol    Family History:  Family History  Problem Relation Age of Onset   Thyroid disease Mother    Eczema Sister    Cancer Paternal Uncle        lung    Lupus Maternal Grandmother    Heart disease Paternal Grandmother     Past medical history, surgical history, medications, allergies, family history and social history reviewed with patient today and changes made to  appropriate areas of the chart.   Review of Systems  Constitutional:  Positive for malaise/fatigue. Negative for fever.  HENT:  Negative for congestion and sore throat.        Ears blocked  Eyes: Negative.   Respiratory: Negative.    Cardiovascular: Negative.   Gastrointestinal: Negative.   Genitourinary: Negative.   Musculoskeletal: Negative.   Skin: Negative.   Neurological: Negative.   Psychiatric/Behavioral:  Negative for depression. The patient has insomnia.    All other ROS negative except what is listed above and in the HPI.      Objective:    BP 122/86 (BP Location: Left Arm, Patient Position: Sitting, Cuff Size: Normal)   Pulse 87   Temp 97.6 F (36.4 C)   Ht 5' (1.524 m)   Wt 231 lb 12.8 oz (105.1 kg)   LMP 04/18/2023 (Exact Date)   SpO2 97%   BMI 45.27 kg/m   Wt Readings from Last 3 Encounters:  04/27/23 231 lb 12.8 oz (105.1 kg)  02/18/22 181 lb (82.1 kg)  11/18/21 177 lb 3.2 oz (80.4 kg)    Physical Exam Vitals and nursing note reviewed.  Constitutional:      General: She is not in acute distress.    Appearance: Normal appearance.  HENT:     Head: Normocephalic and atraumatic.     Right Ear: Ear canal and external ear normal. A middle ear effusion is present. Tympanic membrane is not erythematous.     Left Ear: Ear canal and external ear normal. A middle ear effusion is present. Tympanic membrane is not erythematous.  Eyes:     Conjunctiva/sclera: Conjunctivae normal.  Cardiovascular:     Rate and Rhythm: Normal rate and regular rhythm.     Pulses: Normal pulses.     Heart sounds: Normal heart sounds.  Pulmonary:     Effort: Pulmonary effort is normal.     Breath sounds: Normal breath sounds.  Abdominal:     Palpations: Abdomen is  soft.     Tenderness: There is no abdominal tenderness.  Musculoskeletal:        General: Normal range of motion.     Cervical back: Normal range of motion and neck supple.     Right lower leg: No edema.     Left lower leg: No edema.  Lymphadenopathy:     Cervical: No cervical adenopathy.  Skin:    General: Skin is warm and dry.  Neurological:     General: No focal deficit present.     Mental Status: She is alert and oriented to person, place, and time.     Cranial Nerves: No cranial nerve deficit.     Coordination: Coordination normal.     Gait: Gait normal.  Psychiatric:        Mood and Affect: Mood normal.        Behavior: Behavior normal.        Thought Content: Thought content normal.        Judgment: Judgment normal.     Results for orders placed or performed in visit on 02/18/22  Cytology - PAP   Collection Time: 02/18/22  8:58 AM  Result Value Ref Range   High risk HPV Negative    Adequacy      Satisfactory for evaluation; transformation zone component ABSENT.   Diagnosis      - Negative for intraepithelial lesion or malignancy (NILM)   Comment Normal Reference Range HPV - Negative  Assessment & Plan:   Problem List Items Addressed This Visit       Respiratory   Asthma, chronic, unspecified asthma severity, uncomplicated   Chronic, stable. Asthma is well-controlled with Wixela, with infrequent use of the rescue inhaler. Refill the albuterol inhaler and continue Wixela as maintenance therapy. Declines pneumonia vaccine today.      Relevant Medications   albuterol (VENTOLIN HFA) 108 (90 Base) MCG/ACT inhaler   predniSONE (DELTASONE) 20 MG tablet     Nervous and Auditory   Carpal tunnel syndrome   Chronic, stable.  She is taking gabapentin 300 mg daily, wears a wrist brace as needed.  Follow-up as symptoms worsen or any concerns.      Relevant Medications   tirzepatide (ZEPBOUND) 2.5 MG/0.5ML Pen     Musculoskeletal and Integument   Eczema    Flares occur in summer, and she uses triamcinolone cream frequently. Refill triamcinolone cream in a jar.       Relevant Medications   triamcinolone cream (KENALOG) 0.1 %     Other   Smoker   She has cut back on her smoking, but is still smoking marijuana.  Encouraged complete tobacco cessation.      Prediabetes   Check CMP, CBC, A1c and treat based on results.       Relevant Orders   CBC with Differential/Platelet   Comprehensive metabolic panel with GFR   Hemoglobin A1c   Lipid panel   Morbid obesity (HCC)   Weight gain occurred after stopping phentermine. She is interested in Zepbound, which may be covered by insurance and can aid in weight loss and sleep apnea treatment. Side effects include nausea, upset stomach, and constipation. Start Zepbound once weekly if covered by insurance. Check blood sugar levels to rule out diabetes. Educated on potential side effects of Zepbound and advised on lifestyle modifications, including smaller, more frequent meals and adequate hydration. Check lipid panel today. Follow-up in 4-6 weeks.       Relevant Medications   tirzepatide (ZEPBOUND) 2.5 MG/0.5ML Pen   Other Relevant Orders   Lipid panel   Routine general medical examination at a health care facility - Primary   Health maintenance reviewed and updated. Discussed nutrition, exercise. Follow-up 1 year.        Other Visit Diagnoses       Snoring       Snoring, apnea at night, daytime fatigue suggests sleep apnea. Referral placed to sleep medicine.   Relevant Orders   Ambulatory referral to Sleep Studies     Dysfunction of both eustachian tubes       She dislikes Flonase due to throat discomfort. Prescribe prednisone 40mg  daily for 5 days for ear drainage. Cont. OTC allergy med        Follow up plan: Return in about 4 weeks (around 05/25/2023) for 4-6 weeks weight management .   LABORATORY TESTING:  - Pap smear: up to date  IMMUNIZATIONS:   - Tdap: Tetanus vaccination  status reviewed: last tetanus booster within 10 years. - Influenza: Postponed to flu season - Pneumovax: Not applicable - Prevnar: Declined - HPV: Not applicable - Shingrix vaccine: Not applicable  SCREENING: -Mammogram: Not applicable  - Colonoscopy: Not applicable  - Bone Density: Not applicable   PATIENT COUNSELING:   Advised to take 1 mg of folate supplement per day if capable of pregnancy.   Sexuality: Discussed sexually transmitted diseases, partner selection, use of condoms, avoidance of unintended pregnancy  and contraceptive alternatives.   Advised  to avoid cigarette smoking.  I discussed with the patient that most people either abstain from alcohol or drink within safe limits (<=14/week and <=4 drinks/occasion for males, <=7/weeks and <= 3 drinks/occasion for females) and that the risk for alcohol disorders and other health effects rises proportionally with the number of drinks per week and how often a drinker exceeds daily limits.  Discussed cessation/primary prevention of drug use and availability of treatment for abuse.   Diet: Encouraged to adjust caloric intake to maintain  or achieve ideal body weight, to reduce intake of dietary saturated fat and total fat, to limit sodium intake by avoiding high sodium foods and not adding table salt, and to maintain adequate dietary potassium and calcium preferably from fresh fruits, vegetables, and low-fat dairy products.    stressed the importance of regular exercise  Injury prevention: Discussed safety belts, safety helmets, smoke detector, smoking near bedding or upholstery.   Dental health: Discussed importance of regular tooth brushing, flossing, and dental visits.    NEXT PREVENTATIVE PHYSICAL DUE IN 1 YEAR. Return in about 4 weeks (around 05/25/2023) for 4-6 weeks weight management .  Danyael Alipio A Jailah Willis

## 2023-04-27 NOTE — Assessment & Plan Note (Signed)
 Check CMP, CBC, A1c and treat based on results.

## 2023-04-28 ENCOUNTER — Other Ambulatory Visit (HOSPITAL_COMMUNITY): Payer: Self-pay

## 2023-04-28 NOTE — Telephone Encounter (Signed)
 Pharmacy Patient Advocate Encounter  Received notification from [facility] [employer] that Prior Authorization for Ozempic (0.25 or 0.5 MG/DOSE) 2MG /3ML pen-injectors has been APPROVED from 3.8.25 to 4.7.26. Ran test claim, Copay is $RTS, RX WAS LAST FILLED ON 4.7.25. This test claim was processed through [facility]- copay amounts may vary at other pharmacies due to pharmacy/plan contracts, or as the patient moves through the different stages of their insurance plan.   PA #/Case ID/Reference #: (Key: [id number])

## 2023-04-29 ENCOUNTER — Other Ambulatory Visit (HOSPITAL_COMMUNITY): Payer: Self-pay

## 2023-05-12 ENCOUNTER — Ambulatory Visit: Admission: EM | Admit: 2023-05-12 | Discharge: 2023-05-12 | Disposition: A | Discharge status: Home / Self Care

## 2023-05-12 ENCOUNTER — Other Ambulatory Visit: Payer: Self-pay | Admitting: Nurse Practitioner

## 2023-05-12 DIAGNOSIS — M5441 Lumbago with sciatica, right side: Secondary | ICD-10-CM | POA: Diagnosis not present

## 2023-05-12 MED ORDER — PREDNISONE 20 MG PO TABS: 20.0000 mg | ORAL_TABLET | Freq: Every day | ORAL | 0 refills | Status: AC

## 2023-05-12 MED ORDER — KETOROLAC TROMETHAMINE 30 MG/ML IJ SOLN
30.0000 mg | Freq: Once | INTRAMUSCULAR | Status: AC
  Administered 2023-05-12: 30 mg via INTRAMUSCULAR

## 2023-05-12 MED ORDER — TIZANIDINE HCL 4 MG PO TABS: 4.0000 mg | ORAL_TABLET | Freq: Three times a day (TID) | ORAL | 0 refills | Status: DC | PRN

## 2023-05-12 MED ORDER — DEXAMETHASONE SODIUM PHOSPHATE 10 MG/ML IJ SOLN
10.0000 mg | Freq: Once | INTRAMUSCULAR | Status: AC
  Administered 2023-05-12: 10 mg via INTRAMUSCULAR

## 2023-05-12 NOTE — Telephone Encounter (Signed)
 Copied from CRM [id number]. Topic: Clinical - Medication Refill >> May 12, 2023 10:35 AM Ovid Blow wrote: Most Recent Primary Care Visit:  Provider: Rheba Cedar A  Department: [facility]  Visit Type: PHYSICAL  Date: 04/27/2023  Medication: fluticasone -salmeterol (WIXELA INHUB) 500-50 MCG/ACT AEPB  Has the patient contacted their pharmacy? Yes (Agent: If no, request that the patient contact the pharmacy for the refill. If patient does not wish to contact the pharmacy document the reason why and proceed with request.) (Agent: If yes, when and what did the pharmacy advise?)  Is this the correct pharmacy for this prescription? Yes If no, delete pharmacy and type the correct one.  This is the patient's preferred pharmacy:  [facility] [address] ([address]), [place] - [address] [place] ([place]) [place] [address] Phone: [phone] Fax: [phone]   Has the prescription been filled recently? No  Is the patient out of the medication? Yes  Has the patient been seen for an appointment in the last year OR does the patient have an upcoming appointment? Yes  Can we respond through MyChart? Yes  Agent: Please be advised that Rx refills may take up to 3 business days. We ask that you follow-up with your pharmacy.

## 2023-05-12 NOTE — Discharge Instructions (Addendum)
 Start prednisone  20 mg tomorrow and take daily with food for 5 days. You can take Tizanidine  4 mg  muscle relaxer every 8 hours while at home, however refrain from taking during the day while working as muscle relaxer can cause drowsiness.  You may purchase seeing a heating pad to apply to lower back this can help reduce inflammation and also help with pain management.  Avoid taking any ibuprofen  or naproxen  while taking prednisone .  You may take Tylenol  intermittently if you are having some mild acute pain while taking the prednisone .  If symptoms improve but completely resolved follow-up at the sports medicine clinic listed above.

## 2023-05-12 NOTE — ED Triage Notes (Signed)
"  I feel like I have pulled something in my back, I did go to the doctor on my [birth year] for the same thing, it improved but now can't walk, the pain is right lower back with radiation to buttocks/leg". No numbness or tingling in foot/leg.

## 2023-05-12 NOTE — ED Provider Notes (Signed)
 [facility]    CSN: [id number] Arrival date & time: 05/12/23  0848      History   Chief Complaint Chief Complaint  Patient presents with   Back Pain    HPI Karen Jensen is a 38 y.o. female.    Back Pain Here today for evaluation of low back pain.  Patient was seen for the same problem by her PCP on 04/27/2023. Reports pain improved [place] and now has increased in intensity and she is having difficulty with ambulation.  Pain is most significant right lower back and is radiating into buttocks and leg.  Denies any numbness or tingling in foot or leg.  Patient has no history of sciatica.  Patient has history of arthritis. Patient has taken  Past Medical History:  Diagnosis Date   Anxiety    Arthritis    BMI 45.0-49.9, adult (HCC)    Chronic asthma    Chronic pain of right ankle    Chronic rhinitis    Eczema    Migraines    Prediabetes    Smoker    Snoring     Patient Active Problem List   Diagnosis Date Noted   Morbid obesity (HCC) 04/27/2023   Routine general medical examination at a health care facility 04/27/2023   Obesity (BMI 30-39.9) 11/18/2021   Eczema 11/18/2021   Menorrhagia with regular cycle 11/18/2021   Prediabetes 11/18/2021   Lip swelling 11/18/2021   Smoker 04/27/2019   Anxiety 06/24/2018   Chronic rhinitis 06/24/2018   Migraine 01/24/2015   Asthma, chronic, unspecified asthma severity, uncomplicated 01/24/2015   Carpal tunnel syndrome 01/24/2015    Past Surgical History:  Procedure Laterality Date   TYMPANOSTOMY TUBE PLACEMENT Bilateral     OB History   No obstetric history on file.      Home Medications    Prior to Admission medications   Medication Sig Start Date End Date Taking? Authorizing Provider  Acetaminophen  (TYLENOL  ARTHRITIS PAIN PO) Take by mouth.   Yes [provider]  lisinopril  (ZESTRIL ) 5 MG tablet Take 1 tablet (5 mg total) by mouth daily. 04/27/23  Yes McElwee, Lauren A, NP  OZEMPIC ,  0.25 OR 0.5 MG/DOSE, 2 MG/3ML SOPN Inject 0.25 mg into the skin once a week. 04/27/23  Yes [provider]  predniSONE  (DELTASONE ) 20 MG tablet Take 1 tablet (20 mg total) by mouth daily with breakfast for 5 days. 05/13/23 05/18/23 Yes Buena Carmine, NP  Semaglutide ,0.25 or 0.5MG /DOS, (OZEMPIC , 0.25 OR 0.5 MG/DOSE,) 2 MG/1.5ML SOPN Inject 0.25 mg into the skin once a week. Start with 0.25MG  once a week x 4 weeks, then increase to 0.5MG  weekly. 04/27/23  Yes McElwee, Lauren A, NP  tiZANidine  (ZANAFLEX ) 4 MG tablet Take 1 tablet (4 mg total) by mouth every 8 (eight) hours as needed for muscle spasms (Back pain). 05/12/23  Yes Buena Carmine, NP  albuterol  (PROVENTIL ) (2.5 MG/3ML) 0.083% nebulizer solution Take 3 mLs (2.5 mg total) by nebulization every 6 (six) hours as needed for wheezing or shortness of breath. 02/18/22   McElwee, Adolfo Hooker, NP  albuterol  (VENTOLIN  HFA) 108 (90 Base) MCG/ACT inhaler Inhale 1-2 puffs into the lungs every 6 (six) hours as needed for wheezing or shortness of breath. 04/27/23   McElwee, Lauren A, NP  diphenhydrAMINE HCl (ALLERGY MEDICATION PO)     [provider]  fluticasone -salmeterol (WIXELA INHUB) 500-50 MCG/ACT AEPB Inhale 1 puff into the lungs in the morning and at bedtime. 01/09/23  McElwee, Lauren A, NP  gabapentin  (NEURONTIN ) 300 MG capsule Take 1 capsule (300 mg total) by mouth daily. 11/18/21   McElwee, Lauren A, NP  HYDROcodone -acetaminophen  (NORCO/VICODIN) 5-325 MG tablet Take 1-2 tablets by mouth every 4 (four) hours as needed.    [provider]  triamcinolone  cream (KENALOG ) 0.1 % Apply 1 Application topically 2 (two) times daily. 04/27/23   McElwee, Adolfo Hooker, NP    Family History Family History  Problem Relation Age of Onset   Thyroid disease Mother    Eczema Sister    Cancer Paternal Uncle        lung   Lupus Maternal Grandmother    Heart disease Paternal Grandmother     Social History Social History   Tobacco Use    Smoking status: Every Day    Current packs/day: 0.50    Average packs/day: 0.5 packs/day for 20.0 years (10.0 ttl pk-yrs)    Types: Cigarettes   Smokeless tobacco: Never  Vaping Use   Vaping status: Never Used  Substance Use Topics   Alcohol use: No    Alcohol/week: 0.0 standard drinks of alcohol   Drug use: Yes    Frequency: 7.0 times per week    Types: Marijuana     Allergies   Other   Review of Systems Review of Systems  Musculoskeletal:  Positive for back pain.     Physical Exam Triage Vital Signs ED Triage Vitals  Encounter Vitals Group     BP 05/12/23 0933 124/83     Systolic BP Percentile --      Diastolic BP Percentile --      Pulse Rate 05/12/23 0933 (!) 105     Resp 05/12/23 0933 18     Temp 05/12/23 0933 98.3 F (36.8 C)     Temp Source 05/12/23 0933 Oral     SpO2 05/12/23 0933 97 %     Weight 05/12/23 0930 227 lb (103 kg)     Height 05/12/23 0930 5' (1.524 m)     Head Circumference --      Peak Flow --      Pain Score 05/12/23 0927 10     Pain Loc --      Pain Education --      Exclude from Growth Chart --    No data found.  Updated Vital Signs BP 124/83 (BP Location: Left Arm)   Pulse (!) 103   Temp 98.3 F (36.8 C) (Oral)   Resp 18   Ht 5' (1.524 m)   Wt 227 lb (103 kg)   LMP 04/18/2023 (Exact Date)   SpO2 97%   BMI 44.33 kg/m   Visual Acuity Right Eye Distance:   Left Eye Distance:   Bilateral Distance:    Right Eye Near:   Left Eye Near:    Bilateral Near:     Physical Exam Vitals reviewed.  Constitutional:      Appearance: Normal appearance.  HENT:     Head: Normocephalic and atraumatic.  Eyes:     Extraocular Movements: Extraocular movements intact.  Neck:     Vascular: No carotid bruit.  Cardiovascular:     Rate and Rhythm: Normal rate and regular rhythm.  Pulmonary:     Effort: Pulmonary effort is normal.     Breath sounds: Normal breath sounds.  Musculoskeletal:     Cervical back: Normal range of motion.      Lumbar back: Tenderness present. Decreased range of motion. Positive left straight leg  raise test. Negative right straight leg raise test.  Lymphadenopathy:     Cervical: No cervical adenopathy.  Neurological:     Mental Status: She is alert.      UC Treatments / Results  Labs (all labs ordered are listed, but only abnormal results are displayed) Labs Reviewed - No data to display  EKG   Radiology No results found.  Procedures Procedures (including critical care time)  Medications Ordered in UC Medications  ketorolac  (TORADOL ) 30 MG/ML injection 30 mg (30 mg Intramuscular Given 05/12/23 1250)  dexamethasone  (DECADRON ) injection 10 mg (10 mg Intramuscular Given 05/12/23 1249)    Initial Impression / Assessment and Plan / UC Course  I have reviewed the triage vital signs and the nursing notes.  Pertinent labs & imaging results that were available during my care of the patient were reviewed by me and considered in my medical decision making (see chart for details).    Acute Right-Sided Low Back Pain with sciatica  No acute injury therefore no imaging obtained.  Decadron  and Toradol  given IM here in clinic for acute inflammation.  Patient will continue home management with prednisone  20 mg starting tomorrow daily for 5 days.  Tizanidine  4 mg can be taken up to times per day however avoid taking when working as medication can cause drowsiness..  Advised to follow-up at [facility] if symptoms worsen or do not improve with treatment. Final Clinical Impressions(s) / UC Diagnoses   Final diagnoses:  Acute right-sided low back pain with right-sided sciatica     Discharge Instructions      Start prednisone  20 mg tomorrow and take daily with food for 5 days. You can take Tizanidine  4 mg  muscle relaxer every 8 hours while at home, however refrain from taking during the day while working as muscle relaxer can cause drowsiness.  You may purchase seeing a heating pad to  apply to lower back this can help reduce inflammation and also help with pain management.  Avoid taking any ibuprofen  or naproxen  while taking prednisone .  You may take Tylenol  intermittently if you are having some mild acute pain while taking the prednisone .  If symptoms improve but completely resolved follow-up at the sports medicine clinic listed above.     ED Prescriptions     Medication Sig Dispense Auth. Provider   predniSONE  (DELTASONE ) 20 MG tablet Take 1 tablet (20 mg total) by mouth daily with breakfast for 5 days. 5 tablet Buena Carmine, NP   tiZANidine  (ZANAFLEX ) 4 MG tablet Take 1 tablet (4 mg total) by mouth every 8 (eight) hours as needed for muscle spasms (Back pain). 30 tablet Buena Carmine, NP      PDMP not reviewed this encounter.   Buena Carmine, NP 05/12/23 1328

## 2023-05-19 ENCOUNTER — Other Ambulatory Visit: Payer: Self-pay | Admitting: Nurse Practitioner

## 2023-05-19 DIAGNOSIS — E119 Type 2 diabetes mellitus without complications: Secondary | ICD-10-CM

## 2023-05-19 NOTE — Telephone Encounter (Signed)
 Requesting: Ozempic  (0.25 or 0.5 MG/DOSE) 2 MG/3ML Subcutaneous Solution Pen-injector  Last Visit: 04/27/2023 Next Visit: 05/28/2023 Last Refill: 04/27/2023  Please Advise

## 2023-05-20 ENCOUNTER — Institutional Professional Consult (permissible substitution): Admitting: Neurology

## 2023-05-26 ENCOUNTER — Ambulatory Visit: Admitting: Neurology

## 2023-05-26 ENCOUNTER — Encounter: Payer: Self-pay | Admitting: Neurology

## 2023-05-26 VITALS — BP 117/81 | HR 110 | Ht 60.0 in | Wt 227.4 lb

## 2023-05-26 DIAGNOSIS — R0683 Snoring: Secondary | ICD-10-CM

## 2023-05-26 DIAGNOSIS — R0681 Apnea, not elsewhere classified: Secondary | ICD-10-CM | POA: Diagnosis not present

## 2023-05-26 DIAGNOSIS — Z9189 Other specified personal risk factors, not elsewhere classified: Secondary | ICD-10-CM

## 2023-05-26 DIAGNOSIS — R519 Headache, unspecified: Secondary | ICD-10-CM

## 2023-05-26 DIAGNOSIS — F17200 Nicotine dependence, unspecified, uncomplicated: Secondary | ICD-10-CM | POA: Diagnosis not present

## 2023-05-26 DIAGNOSIS — F1290 Cannabis use, unspecified, uncomplicated: Secondary | ICD-10-CM

## 2023-05-26 DIAGNOSIS — G4726 Circadian rhythm sleep disorder, shift work type: Secondary | ICD-10-CM

## 2023-05-26 NOTE — Patient Instructions (Signed)
 Thank you for choosing [facility] for your sleep related care! It was nice to meet you today!   Here is what we discussed today:    Based on your symptoms and your exam I believe you are at risk for obstructive sleep apnea (aka OSA). We should proceed with a sleep study to determine whether you do or do not have OSA and how severe it is. Even, if you have mild OSA, I may want you to consider treatment with CPAP, as treatment of even borderline or mild sleep apnea can result and improvement of symptoms such as sleep disruption, daytime sleepiness, nighttime bathroom breaks, restless leg symptoms, improvement of headache syndromes, even improved mood disorder.   As explained, an attended sleep study (meaning you get to stay overnight in the sleep lab), lets us  monitor sleep-related behaviors such as sleep talking and leg movements in sleep, in addition to monitoring for sleep apnea.  A home sleep test is a screening tool for sleep apnea diagnosis only, but unfortunately, does not help with any other sleep-related diagnoses.  Please remember, the long-term risks and ramifications of untreated moderate to severe obstructive sleep apnea may include (but are not limited to): increased risk for cardiovascular disease, including congestive heart failure, stroke, difficult to control hypertension, treatment resistant obesity, arrhythmias, especially irregular heartbeat commonly known as A. Fib. (atrial fibrillation); even type 2 diabetes has been linked to untreated OSA.   Other correlations that untreated obstructive sleep apnea include macular edema which is swelling of the retina in the eyes, droopy eyelid syndrome, and elevated hemoglobin and hematocrit levels (often referred to as polycythemia).  Sleep apnea can cause disruption of sleep and sleep deprivation in most cases, which, in turn, can cause recurrent headaches, problems with memory, mood, concentration, focus, and vigilance. Most  people with untreated sleep apnea report excessive daytime sleepiness, which can affect their ability to drive. Please do not drive or use heavy equipment or machinery, if you feel sleepy! Patients with sleep apnea can also develop difficulty initiating and maintaining sleep (aka insomnia).   Having sleep apnea may increase your risk for other sleep disorders, including involuntary behaviors sleep such as sleep terrors, sleep talking, sleepwalking.    Having sleep apnea can also increase your risk for restless leg syndrome and leg movements at night.   Please note that untreated obstructive sleep apnea may carry additional perioperative morbidity. Patients with significant obstructive sleep apnea (typically, in the moderate to severe degree) should receive, if possible, perioperative PAP (positive airway pressure) therapy and the surgeons and particularly the anesthesiologists should be informed of the diagnosis and the severity of the sleep disordered breathing.   We will call you or email you through MyChart with regards to your test results and plan a follow-up in sleep clinic accordingly. Most likely, you will hear from one of our nurses.   Please do not drive when feeling sleepy.  Please work aggressively on smoking cessation as well as marijuana cessation.  Marijuana use can impair your vigilance, cause sluggishness in your thinking, cause cognitive impairment, tiredness, as well as sleep disturbances.  Our sleep lab administrative assistant will call you to schedule your sleep study and give you further instructions, regarding the check in process for the sleep study, arrival time, what to bring, when you can expect to leave after the study, etc., and to answer any other logistical questions you may have. If you don't hear back from her by about 2 weeks from now,  please feel free to call her direct line at [phone] or you can call our general clinic number, or email us  through My Chart.

## 2023-05-26 NOTE — Progress Notes (Signed)
 Subjective:    Patient ID: Karen Jensen is a 38 y.o. female.  HPI    Karen Fairy, MD, PhD [facility] [address], [address] [address] [place], [place] [address]  Dear Karen Jensen,    I saw your patient, Karen Jensen, upon your kind request in my sleep clinic today for initial consultation of her sleep disorder, in particular, concern for underlying obstructive sleep apnea.  The patient is unaccompanied today.  As you know, Karen Jensen is a 38 year old female with an underlying medical history of asthma, smoking, arthritis, eczema, migraine headaches, prediabetes, anxiety, and severe obesity with a BMI of over 40, who reports snoring and excessive daytime somnolence as well as witnessed apneas, per SO.  Her Epworth sleepiness score is 8 out of 24, fatigue severity score is 33 out of 63.  I reviewed your office note from 04/27/2023.  She works third shift at the [employer].  She typically goes to bed around 8 AM and wakes up around noon and then she may take a 2 to 3-hour nap before going to work.  She lives alone.  She has 1 dog in the household and the dog typically sleeps in his crate at night not in her bedroom.  She does not drink caffeine daily.  She drinks no alcohol currently, she smokes half a pack per day, she is working on smoking cessation.  She also smokes marijuana daily, 3-4 times a day.  She is advised that marijuana smoking can impair her vigilance.  She reports that she feels as though it helps her with sleep and with stress.  She does admit that they are not allowed to smoke marijuana per work Tour manager.  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  In addition, cannabis can adversely affect sleep habits and impair alertness and cause sluggish thinking.  She denies nightly nocturia.  She has had occasional morning headaches in the past, not recently.  She is working on weight  loss and started Ozempic  about 4 weeks ago.  She has lost about 3 pounds thus far.  Her Past Medical History Is Significant For: Past Medical History:  Diagnosis Date   Anxiety    Arthritis    BMI 45.0-49.9, adult (HCC)    Chronic asthma    Chronic pain of right ankle    Chronic rhinitis    Eczema    Migraines    Prediabetes    Smoker    Snoring     Her Past Surgical History Is Significant For: Past Surgical History:  Procedure Laterality Date   TYMPANOSTOMY TUBE PLACEMENT Bilateral     Her Family History Is Significant For: Family History  Problem Relation Age of Onset   Thyroid disease Mother    Eczema Sister    Cancer Paternal Uncle        lung   Lupus Maternal Grandmother    Heart disease Paternal Grandmother     Her Social History Is Significant For: Social History   Socioeconomic History   Marital status: Single    Spouse name: Not on file   Number of children: 0   Years of education: 12+   Highest education level: Not on file  Occupational History   Occupation: Set designer  Tobacco Use   Smoking status: Every Day    Current packs/day: 0.50    Average packs/day: 0.5 packs/day for 20.0 years (10.0 ttl pk-yrs)  Types: Cigarettes   Smokeless tobacco: Never  Vaping Use   Vaping status: Never Used  Substance and Sexual Activity   Alcohol use: No    Alcohol/week: 0.0 standard drinks of alcohol   Drug use: Yes    Frequency: 7.0 times per week    Types: Marijuana   Sexual activity: Yes    Partners: Female    Birth control/protection: None  Other Topics Concern   Not on file  Social History Narrative   Not on file   Social Drivers of Health   Financial Resource Strain: Not on file  Food Insecurity: Not on file  Transportation Needs: Not on file  Physical Activity: Not on file  Stress: Not on file (11/27/2022)  Social Connections: Unknown (06/03/2021)   [facility]   Social Network    Social Network: Not on  file    Her Allergies Are:  Allergies  Allergen Reactions   Other     WOOL produces rash  :   Her Current Medications Are:  Outpatient Encounter Medications as of 05/26/2023  Medication Sig   albuterol  (PROVENTIL ) (2.5 MG/3ML) 0.083% nebulizer solution Take 3 mLs (2.5 mg total) by nebulization every 6 (six) hours as needed for wheezing or shortness of breath.   albuterol  (VENTOLIN  HFA) 108 (90 Base) MCG/ACT inhaler Inhale 1-2 puffs into the lungs every 6 (six) hours as needed for wheezing or shortness of breath.   fluticasone -salmeterol (WIXELA INHUB) 500-50 MCG/ACT AEPB Inhale 1 puff into the lungs in the morning and at bedtime.   gabapentin  (NEURONTIN ) 300 MG capsule Take 1 capsule (300 mg total) by mouth daily.   lisinopril  (ZESTRIL ) 5 MG tablet Take 1 tablet (5 mg total) by mouth daily.   OZEMPIC , 0.25 OR 0.5 MG/DOSE, 2 MG/3ML SOPN Inject 0.25 mg into the skin once a week.   tiZANidine  (ZANAFLEX ) 4 MG tablet Take 1 tablet (4 mg total) by mouth every 8 (eight) hours as needed for muscle spasms (Back pain).   triamcinolone  cream (KENALOG ) 0.1 % Apply 1 Application topically 2 (two) times daily.   Acetaminophen  (TYLENOL  ARTHRITIS PAIN PO) Take by mouth. (Patient not taking: Reported on 05/26/2023)   diphenhydrAMINE HCl (ALLERGY MEDICATION PO)  (Patient not taking: Reported on 05/26/2023)   HYDROcodone -acetaminophen  (NORCO/VICODIN) 5-325 MG tablet Take 1-2 tablets by mouth every 4 (four) hours as needed. (Patient not taking: Reported on 05/26/2023)   OZEMPIC , 0.25 OR 0.5 MG/DOSE, 2 MG/3ML SOPN INJECT 1/4 (ONE-FOURTH) MG  ONCE A WEEK *  THEN  INCREASE  TO  ONE-HALF  MG  WEEKLY* (Patient not taking: Reported on 05/26/2023)   No facility-administered encounter medications on file as of 05/26/2023.  :   Review of Systems:  Out of a complete 14 point review of systems, all are reviewed and negative with the exception of these symptoms as listed below:  Review of Systems  Neurological:        Room  5 Pt is here Alone. Pt states that she hears herself snore sometimes. Pt states that someone had told her that she has stopped breathing while she was sleeping. Pt states that she gets up at least 1 time at night. Pt states that she sometimes has migraines and headaches.     Objective:  Neurological Exam  Physical Exam Physical Examination:   Vitals:   05/26/23 1019  BP: 117/81  Pulse: (!) 110    General Examination: The patient is a very pleasant 38 y.o. female in no  acute distress. She appears well-developed and well-nourished and well groomed.   HEENT: Normocephalic, atraumatic, pupils are equal, round and reactive to light, extraocular tracking is good without limitation to gaze excursion or nystagmus noted. Hearing is grossly intact. Face is symmetric with normal facial animation. Speech is clear with no dysarthria noted. There is no hypophonia. There is no lip, neck/head, jaw or voice tremor. Neck is supple with full range of passive and active motion. There are no carotid bruits on auscultation. Oropharynx exam reveals: mild mouth dryness, adequate dental hygiene and moderate airway crowding, due to small airway entry, elongated tongue, Mallampati class IV, tonsils on the smaller side, tongue protrudes centrally and palate elevates symmetrically but tip of uvula not fully visualized.  Neck circumference 16-1/4 inches.  Chest: Clear to auscultation without wheezing, rhonchi or crackles noted.  Heart: S1+S2+0, regular and normal without murmurs, rubs or gallops noted.   Abdomen: Soft, non-tender and non-distended.  Extremities: There is no pitting edema in the distal lower extremities bilaterally.   Skin: Warm and dry without trophic changes noted.   Musculoskeletal: exam reveals no obvious joint deformities.   Neurologically:  Mental status: The patient is awake, alert and oriented in all 4 spheres. Her immediate and remote memory, attention, language skills and fund of  knowledge are appropriate. There is no evidence of aphasia, agnosia, apraxia or anomia. Speech is clear with normal prosody and enunciation. Thought process is linear. Mood is normal and affect is normal.  Cranial nerves II - XII are as described above under HEENT exam.  Motor exam: Normal bulk, strength and tone is noted. There is no obvious action or resting tremor.  Fine motor skills and coordination: grossly intact.  Cerebellar testing: No dysmetria or intention tremor. There is no truncal or gait ataxia.  Sensory exam: intact to light touch in the upper and lower extremities.  Gait, station and balance: She stands easily. No veering to one side is noted. No leaning to one side is noted. Posture is age-appropriate and stance is narrow based. Gait shows normal stride length and normal pace. No problems turning are noted.   Assessment and Plan:  In summary, Karen Jensen is a very pleasant 38 y.o.-year old female with an underlying medical history of asthma, smoking, marijuana use, arthritis, eczema, migraine headaches, prediabetes, anxiety, and severe obesity with a BMI of over 40, whose history and physical exam are concerning for sleep disordered breathing, particularly obstructive sleep apnea (OSA). A laboratory attended sleep study is typically considered "gold standard" for evaluation of sleep disordered breathing.   I had a long chat with the patient about my findings and the diagnosis of sleep apnea, particularly OSA, its prognosis and treatment options. We talked about medical/conservative treatments, surgical interventions and non-pharmacological approaches for symptom control. I explained, in particular, the risks and ramifications of untreated moderate to severe OSA, especially with respect to developing cardiovascular disease down the road, including congestive heart failure (CHF), difficult to treat hypertension, cardiac arrhythmias (particularly A-fib), neurovascular complications  including TIA, stroke and dementia. Even type 2 diabetes has, in part, been linked to untreated OSA. Symptoms of untreated OSA may include (but may not be limited to) daytime sleepiness, nocturia (i.e. frequent nighttime urination), memory problems, mood irritability and suboptimally controlled or worsening mood disorder such as depression and/or anxiety, lack of energy, lack of motivation, physical discomfort, as well as recurrent headaches, especially morning or nocturnal headaches. We talked about the importance of maintaining a healthy lifestyle and  striving for healthy weight.  The importance of complete smoking and marijuana use cessation was also addressed.  The concerns with daily marijuana use were specifically addressed and she was strongly encouraged to discontinue smoking marijuana.  In addition, we talked about the importance of striving for and maintaining good sleep hygiene. I recommended a sleep study at this time. I outlined the differences between a laboratory attended sleep study which is considered more comprehensive and accurate over the option of a home sleep test (HST); the latter may lead to underestimation of sleep disordered breathing in some instances and does not help with diagnosing upper airway resistance syndrome and is not accurate enough to diagnose primary central sleep apnea typically. I outlined possible surgical and non-surgical treatment options of OSA, including the use of a positive airway pressure (PAP) device (i.e. CPAP, AutoPAP/APAP or BiPAP in certain circumstances), a custom-made dental device (aka oral appliance, which would require a referral to a specialist dentist or orthodontist typically, and is generally speaking not considered for patients with full dentures or edentulous state), upper airway surgical options, such as traditional UPPP (which is not considered a first-line treatment) or the Inspire device (hypoglossal nerve stimulator, which would involve a  referral for consultation with an ENT surgeon, after careful selection, following inclusion criteria - also not first-line treatment). I explained the PAP treatment option to the patient in detail, as this is generally considered first-line treatment.  The patient indicated that she would be willing to try PAP therapy, if the need arises. I explained the importance of being compliant with PAP treatment, not only for insurance purposes but primarily to improve patient's symptoms symptoms, and for the patient's long term health benefit, including to reduce Her cardiovascular risks longer-term.    We will pick up our discussion about the next steps and treatment options after testing.  We will keep her posted as to the test results by phone call and/or MyChart messaging where possible.  We will plan to follow-up in sleep clinic accordingly as well.  I answered all her questions today and the patient was in agreement.   I encouraged her to call with any interim questions, concerns, problems or updates or email us  through MyChart.  Generally speaking, sleep test authorizations may take up to 2 weeks, sometimes less, sometimes longer, the patient is encouraged to get in touch with us  if they do not hear back from the sleep lab staff directly within the next 2 weeks.  Thank you very much for allowing me to participate in the care of this nice patient. If I can be of any further assistance to you please do not hesitate to call me at [phone].  Sincerely,   Karen Fairy, MD, PhD

## 2023-05-28 ENCOUNTER — Ambulatory Visit: Admitting: Nurse Practitioner

## 2023-05-28 ENCOUNTER — Encounter: Payer: Self-pay | Admitting: Nurse Practitioner

## 2023-05-28 VITALS — BP 122/82 | HR 89 | Temp 97.2°F | Ht 61.0 in | Wt 227.0 lb

## 2023-05-28 DIAGNOSIS — L853 Xerosis cutis: Secondary | ICD-10-CM

## 2023-05-28 DIAGNOSIS — F4321 Adjustment disorder with depressed mood: Secondary | ICD-10-CM | POA: Diagnosis not present

## 2023-05-28 DIAGNOSIS — E119 Type 2 diabetes mellitus without complications: Secondary | ICD-10-CM

## 2023-05-28 DIAGNOSIS — G8929 Other chronic pain: Secondary | ICD-10-CM | POA: Insufficient documentation

## 2023-05-28 DIAGNOSIS — Z7985 Long-term (current) use of injectable non-insulin antidiabetic drugs: Secondary | ICD-10-CM | POA: Diagnosis not present

## 2023-05-28 DIAGNOSIS — M5441 Lumbago with sciatica, right side: Secondary | ICD-10-CM | POA: Diagnosis not present

## 2023-05-28 LAB — BASIC METABOLIC PANEL WITH GFR
BUN: 11 mg/dL (ref 6–23)
CO2: 22 meq/L (ref 19–32)
Calcium: 9.7 mg/dL (ref 8.4–10.5)
Chloride: 105 meq/L (ref 96–112)
Creatinine, Ser: 0.83 mg/dL (ref 0.40–1.20)
GFR: 90.91 mL/min (ref >60.00)
Glucose, Bld: 120 mg/dL — ABNORMAL HIGH (ref 70–99)
Potassium: 3.8 meq/L (ref 3.5–5.1)
Sodium: 137 meq/L (ref 135–145)

## 2023-05-28 MED ORDER — FLUTICASONE-SALMETEROL 500-50 MCG/ACT IN AEPB: 1.0000 | INHALATION_SPRAY | Freq: Two times a day (BID) | RESPIRATORY_TRACT | 3 refills | Status: AC

## 2023-05-28 MED ORDER — GABAPENTIN 300 MG PO CAPS: 300.0000 mg | ORAL_CAPSULE | Freq: Two times a day (BID) | ORAL | 1 refills | Status: AC

## 2023-05-28 MED ORDER — HYDROCORTISONE 0.5 % EX CREA: 1.0000 | TOPICAL_CREAM | Freq: Two times a day (BID) | CUTANEOUS | 0 refills | Status: AC

## 2023-05-28 NOTE — Patient Instructions (Signed)
 It was great to see you!  Start hydrocortisone cream twice a day below you eye (don't get in your eye)  We are checking your labs today and will let you know the results via mychart/phone.   We will do a urine sample next visit to check your kidneys  I recommend a yearly eye exam with an eye doctor   Increase your gabapentin  to twice a day  I have placed a referral to a therapist   Next week go up to 0.5mg  on the ozempic   Let's follow-up in 2 months, sooner if you have concerns.  If a referral was placed today, you will be contacted for an appointment. Please note that routine referrals can sometimes take up to 3-4 weeks to process. Please call our office if you haven't heard anything after this time frame.  Take care,  Rheba Cedar, NP

## 2023-05-28 NOTE — Assessment & Plan Note (Signed)
 She experiences grief and anticipatory depression following her grandmother's death and is seeking therapy. Place a referral to a therapist.

## 2023-05-28 NOTE — Progress Notes (Signed)
 Established Patient Office Visit  Subjective   Patient ID: DEYSHA MCCALPIN, female    DOB: 1985-07-12  Age: 38 y.o. MRN: [id number]  Chief Complaint  Patient presents with   Weight Management    Follow up, concerns with sciatic pain, referral for a therapist, Rx refill    HPI  Discussed the use of AI scribe software for clinical note transcription with the patient, who gave verbal consent to proceed.  History of Present Illness   Alpha L Ballek "Q" is a 38 year old female with sciatica who presents with worsening back pain and difficulty sleeping.  She experiences worsening lower back pain and sciatica, primarily on the right side, radiating to the thigh. The pain does not extend to the foot. She went to urgent care on 05/12/23 and Toradol  and steroid injections provided some relief, but significant discomfort persists, especially at night, affecting her sleep. Over-the-counter pain medication, Bayer Back and Body, helps during the day. Her job, which involves sitting and microscope work, exacerbates her symptoms. Exercises and stretches offer some improvement.  She recently started Ozempic  for diabetes, resulting in decreased appetite and smaller meals. Constipation is managed with apple juice. She focuses on a diet rich in fruits and vegetables and considers protein supplements for nutrition. She started lisinopril  for kidney protection. There is no chest pain, shortness of breath, or numbness and tingling in her feet.  She reports a recent scratch near her eye, likely due to allergies, and uses cold compresses for relief. She is also dealing with the recent loss of her grandmother and is seeking therapy to address her mental health.        ROS See pertinent positives and negatives per HPI.    Objective:     BP 122/82 (BP Location: Left Arm, Patient Position: Sitting, Cuff Size: Normal)   Pulse 89   Temp (!) 97.2 F (36.2 C)   Ht 5\' 1"  (1.549 m)   Wt 227 lb (103  kg)   LMP 05/20/2023 (Exact Date)   SpO2 99%   BMI 42.89 kg/m  BP Readings from Last 3 Encounters:  05/28/23 122/82  05/26/23 117/81  05/12/23 124/83   Wt Readings from Last 3 Encounters:  05/28/23 227 lb (103 kg)  05/26/23 227 lb 6.4 oz (103.1 kg)  05/12/23 227 lb (103 kg)      Physical Exam Vitals and nursing note reviewed.  Constitutional:      General: She is not in acute distress.    Appearance: Normal appearance.  HENT:     Head: Normocephalic.  Eyes:     General:        Right eye: No foreign body, discharge or hordeolum.        Left eye: No foreign body, discharge or hordeolum.     Conjunctiva/sclera: Conjunctivae normal.     Comments: Dry skin below left eye  Cardiovascular:     Rate and Rhythm: Normal rate and regular rhythm.     Pulses: Normal pulses.     Heart sounds: Normal heart sounds.  Pulmonary:     Effort: Pulmonary effort is normal.     Breath sounds: Normal breath sounds.  Musculoskeletal:        General: No tenderness. Normal range of motion.     Cervical back: Normal range of motion.  Skin:    General: Skin is warm.  Neurological:     General: No focal deficit present.     Mental Status: She is alert  and oriented to person, place, and time.  Psychiatric:        Mood and Affect: Mood normal.        Behavior: Behavior normal.        Thought Content: Thought content normal.        Judgment: Judgment normal.      Assessment & Plan:   Problem List Items Addressed This Visit       Endocrine   Diabetes mellitus without complication (HCC) - Primary   Chronic, stable managed with Ozempic  for appetite and weight control, and Lisinopril  5mg  daily for renal protection. Potential complications were discussed, emphasizing glycemic control. Continue Ozempic , increasing to 0.5 mg after the current dose, and continue Lisinopril . Recommend an annual ophthalmologic exam and monitor skin for lesions and healing. Encourage a balanced diet with adequate  protein and calories. Perform blood work to assess renal function.      Relevant Orders   Basic metabolic panel with GFR     Nervous and Auditory   Chronic right-sided low back pain with right-sided sciatica   Sciatica with lumbar pain radiating to the right thigh has improved since the initial visit. Pain is still bothersome at night. Discussed the risk of recurrence and the importance of stretching. Increase gabapentin  300mg  to twice daily and provide printed sciatica stretches. Encourage daily stretching exercises.      Relevant Medications   gabapentin  (NEURONTIN ) 300 MG capsule     Other   Grief   She experiences grief and anticipatory depression following her grandmother's death and is seeking therapy. Place a referral to a therapist.      Relevant Orders   Ambulatory referral to Psychology   Other Visit Diagnoses       Dry skin       Possibly allergy-related, with no infection or conjunctivitis present. Start hydrocortisone cream twice daily on the affected area, avoiding the eye.       Return in about 2 months (around 07/28/2023) for Diabetes.    Odette Benjamin, NP

## 2023-05-28 NOTE — Assessment & Plan Note (Signed)
 Chronic, stable managed with Ozempic  for appetite and weight control, and Lisinopril  5mg  daily for renal protection. Potential complications were discussed, emphasizing glycemic control. Continue Ozempic , increasing to 0.5 mg after the current dose, and continue Lisinopril . Recommend an annual ophthalmologic exam and monitor skin for lesions and healing. Encourage a balanced diet with adequate protein and calories. Perform blood work to assess renal function.

## 2023-05-28 NOTE — Assessment & Plan Note (Signed)
 Sciatica with lumbar pain radiating to the right thigh has improved since the initial visit. Pain is still bothersome at night. Discussed the risk of recurrence and the importance of stretching. Increase gabapentin  300mg  to twice daily and provide printed sciatica stretches. Encourage daily stretching exercises.

## 2023-06-05 ENCOUNTER — Ambulatory Visit

## 2023-06-05 ENCOUNTER — Telehealth: Payer: Self-pay

## 2023-06-05 DIAGNOSIS — F1290 Cannabis use, unspecified, uncomplicated: Secondary | ICD-10-CM

## 2023-06-05 DIAGNOSIS — F17200 Nicotine dependence, unspecified, uncomplicated: Secondary | ICD-10-CM

## 2023-06-05 DIAGNOSIS — R519 Headache, unspecified: Secondary | ICD-10-CM

## 2023-06-05 DIAGNOSIS — R0681 Apnea, not elsewhere classified: Secondary | ICD-10-CM

## 2023-06-05 DIAGNOSIS — G4726 Circadian rhythm sleep disorder, shift work type: Secondary | ICD-10-CM

## 2023-06-05 DIAGNOSIS — Z9189 Other specified personal risk factors, not elsewhere classified: Secondary | ICD-10-CM

## 2023-06-05 DIAGNOSIS — R0683 Snoring: Secondary | ICD-10-CM

## 2023-06-05 NOTE — Telephone Encounter (Signed)
 LVM for patient informing her the referral are in pending status and is waiting on approval by insurance

## 2023-06-05 NOTE — Telephone Encounter (Signed)
 Copied from CRM [id number]. Topic: Referral - Question >> Jun 05, 2023  8:22 AM Clydene Darner H wrote: Reason for CRM: Patient called to inform the [facility] nurse regarding a referral that was placed. She stated she has not received any follow-up communication and is requesting an update on the next steps.

## 2023-06-23 ENCOUNTER — Other Ambulatory Visit: Payer: Self-pay | Admitting: Nurse Practitioner

## 2023-06-23 DIAGNOSIS — E119 Type 2 diabetes mellitus without complications: Secondary | ICD-10-CM

## 2023-06-23 NOTE — Telephone Encounter (Signed)
 Requesting: Ozempic  (0.25 or 0.5 MG/DOSE) 2 MG/3ML Subcutaneous Solution Pen-injector  Last Visit: 05/28/2023 Next Visit: 07/28/2023 Last Refill: 05/20/2023  Please Advise

## 2023-06-23 NOTE — Telephone Encounter (Signed)
 Left message for patient to return call.

## 2023-06-23 NOTE — Telephone Encounter (Signed)
 Patient returned call to office. I asked patient if she was still taking the Ozempic  due to note on 05/20/23 that patient was not taking and reported on 05/26/23. Patient said that she is taking this as directed and request a Rx of additional pen needles to use with the pen.

## 2023-06-24 ENCOUNTER — Other Ambulatory Visit: Payer: Self-pay | Admitting: Nurse Practitioner

## 2023-06-24 MED ORDER — INSULIN PEN NEEDLE 32G X 4 MM MISC: 1.0000 | 0 refills | Status: AC

## 2023-07-16 ENCOUNTER — Ambulatory Visit
Admission: EM | Admit: 2023-07-16 | Discharge: 2023-07-16 | Disposition: A | Discharge status: Home / Self Care | Attending: Physician Assistant | Admitting: Physician Assistant

## 2023-07-16 ENCOUNTER — Encounter: Payer: Self-pay | Admitting: Emergency Medicine

## 2023-07-16 DIAGNOSIS — H5712 Ocular pain, left eye: Secondary | ICD-10-CM | POA: Diagnosis not present

## 2023-07-16 DIAGNOSIS — H00014 Hordeolum externum left upper eyelid: Secondary | ICD-10-CM

## 2023-07-16 MED ORDER — DOXYCYCLINE HYCLATE 100 MG PO CAPS: 100.0000 mg | ORAL_CAPSULE | Freq: Two times a day (BID) | ORAL | 0 refills | Status: AC

## 2023-07-16 MED ORDER — FLUCONAZOLE 150 MG PO TABS
ORAL_TABLET | ORAL | 0 refills | Status: AC
Start: 2023-07-16 — End: ?

## 2023-07-16 MED ORDER — POLYMYXIN B-TRIMETHOPRIM 10000-0.1 UNIT/ML-% OP SOLN: 1.0000 [drp] | OPHTHALMIC | 0 refills | Status: AC

## 2023-07-16 NOTE — ED Triage Notes (Signed)
 Pt reports L eye pain x1 week. Initially had a stye on L eyelid that resolved with warm compresses. Pt states after the stye resolved she started having pain in inner corner of L eye. This pain is causing migraines and pt reports vision changes -some blurriness and color changes. Pt has prescription glasses that are supposed to be worn all the time, but has not been wearing them due to pain in eye.

## 2023-07-16 NOTE — ED Provider Notes (Signed)
 [facility]    CSN: [id number] Arrival date & time: 07/16/23  0802      History   Chief Complaint Chief Complaint  Patient presents with   Eye Pain    HPI Karen Jensen is a 38 y.o. female.   Presents today for evaluation of left eye pain that started a week ago.  She reports that initially she had a stye to her left eyelid and this resolved with warm compresses.  She states after that improved she started to have some pain in the inner corner of her left eye.  She reports at times she has had some migraines due to eye issues as well as some blurriness at times.  She does have prescription glasses but she states she does not wear them all the time.  She has not had any nausea or vomiting.  The history is provided by the patient.    Past Medical History:  Diagnosis Date   Anxiety    Arthritis    BMI 45.0-49.9, adult (HCC)    Chronic asthma    Chronic pain of right ankle    Chronic rhinitis    Eczema    Migraines    Prediabetes    Smoker    Snoring     Patient Active Problem List   Diagnosis Date Noted   Diabetes mellitus without complication (HCC) 05/28/2023   Chronic right-sided low back pain with right-sided sciatica 05/28/2023   Grief 05/28/2023   Morbid obesity (HCC) 04/27/2023   Routine general medical examination at a health care facility 04/27/2023   Obesity (BMI 30-39.9) 11/18/2021   Eczema 11/18/2021   Menorrhagia with regular cycle 11/18/2021   Prediabetes 11/18/2021   Lip swelling 11/18/2021   Smoker 04/27/2019   Anxiety 06/24/2018   Chronic rhinitis 06/24/2018   Migraine 01/24/2015   Asthma, chronic, unspecified asthma severity, uncomplicated 01/24/2015   Carpal tunnel syndrome 01/24/2015    Past Surgical History:  Procedure Laterality Date   TYMPANOSTOMY TUBE PLACEMENT Bilateral     OB History   No obstetric history on file.      Home Medications    Prior to Admission medications   Medication Sig Start Date End  Date Taking? Authorizing Provider  albuterol  (VENTOLIN  HFA) 108 (90 Base) MCG/ACT inhaler Inhale 1-2 puffs into the lungs every 6 (six) hours as needed for wheezing or shortness of breath. 04/27/23  Yes McElwee, Lauren A, NP  diphenhydrAMINE HCl (ALLERGY MEDICATION PO)    Yes [provider]  doxycycline  (VIBRAMYCIN ) 100 MG capsule Take 1 capsule (100 mg total) by mouth 2 (two) times daily for 7 days. 07/16/23 07/23/23 Yes Billy Asberry FALCON, PA-C  fluconazole  (DIFLUCAN ) 150 MG tablet Take one tab PO today and repeat dose in 3 days if symptoms persist 07/16/23  Yes Billy Asberry FALCON, PA-C  fluticasone -salmeterol (WIXELA INHUB) 500-50 MCG/ACT AEPB Inhale 1 puff into the lungs in the morning and at bedtime. 05/28/23  Yes McElwee, Lauren A, NP  gabapentin  (NEURONTIN ) 300 MG capsule Take 1 capsule (300 mg total) by mouth 2 (two) times daily. 05/28/23  Yes McElwee, Lauren A, NP  Insulin  Pen Needle 32G X 4 MM MISC 1 each by Does not apply route once a week. 06/24/23  Yes McElwee, Lauren A, NP  lisinopril  (ZESTRIL ) 5 MG tablet Take 1 tablet (5 mg total) by mouth daily. 04/27/23  Yes McElwee, Lauren A, NP  Semaglutide ,0.25 or 0.5MG /DOS, (OZEMPIC , 0.25 OR 0.5 MG/DOSE,) 2 MG/3ML SOPN Inject 0.5 mg  into the skin once a week. 06/24/23  Yes McElwee, Lauren A, NP  triamcinolone  cream (KENALOG ) 0.1 % Apply 1 Application topically 2 (two) times daily. 04/27/23  Yes McElwee, Lauren A, NP  trimethoprim -polymyxin b  (POLYTRIM ) ophthalmic solution Place 1 drop into the left eye every 4 (four) hours for 7 days. 07/16/23 07/23/23 Yes Billy Asberry FALCON, PA-C  Acetaminophen  (TYLENOL  ARTHRITIS PAIN PO) Take by mouth. Patient not taking: Reported on 05/28/2023    [provider]  albuterol  (PROVENTIL ) (2.5 MG/3ML) 0.083% nebulizer solution Take 3 mLs (2.5 mg total) by nebulization every 6 (six) hours as needed for wheezing or shortness of breath. 02/18/22   McElwee, Tinnie LABOR, NP  HYDROcodone -acetaminophen  (NORCO/VICODIN) 5-325 MG tablet  Take 1-2 tablets by mouth every 4 (four) hours as needed. Patient not taking: Reported on 05/28/2023    [provider]  hydrocortisone  cream 0.5 % Apply 1 Application topically 2 (two) times daily. Patient not taking: Reported on 07/16/2023 05/28/23   Nedra Tinnie LABOR, NP  tiZANidine  (ZANAFLEX ) 4 MG tablet Take 1 tablet (4 mg total) by mouth every 8 (eight) hours as needed for muscle spasms (Back pain). Patient not taking: Reported on 07/16/2023 05/12/23   Arloa Suzen RAMAN, NP  UNABLE TO FIND as needed. Med Name: [facility]    [provider]    Family History Family History  Problem Relation Age of Onset   Thyroid disease Mother    Eczema Sister    Cancer Paternal Uncle        lung   Lupus Maternal Grandmother    Heart disease Paternal Grandmother     Social History Social History   Tobacco Use   Smoking status: Every Day    Current packs/day: 0.50    Average packs/day: 0.5 packs/day for 20.0 years (10.0 ttl pk-yrs)    Types: Cigarettes    Passive exposure: Current   Smokeless tobacco: Never  Vaping Use   Vaping status: Never Used  Substance Use Topics   Alcohol use: No    Alcohol/week: 0.0 standard drinks of alcohol   Drug use: Yes    Frequency: 7.0 times per week    Types: Marijuana     Allergies   Other   Review of Systems Review of Systems  Constitutional:  Negative for chills and fever.  Eyes:  Positive for pain. Negative for photophobia, discharge and redness.  Respiratory:  Negative for shortness of breath.   Gastrointestinal:  Negative for abdominal pain, nausea and vomiting.  Neurological:  Negative for headaches.     Physical Exam Triage Vital Signs ED Triage Vitals  Encounter Vitals Group     BP      Girls Systolic BP Percentile      Girls Diastolic BP Percentile      Boys Systolic BP Percentile      Boys Diastolic BP Percentile      Pulse      Resp      Temp      Temp src      SpO2      Weight      Height       Head Circumference      Peak Flow      Pain Score      Pain Loc      Pain Education      Exclude from Growth Chart    No data found.  Updated Vital Signs BP (!) 140/96 (BP Location: Right Arm)  Pulse 85   Temp 98.6 F (37 C) (Oral)   Resp 16   LMP 06/21/2023 (Approximate)   SpO2 98%   Visual Acuity Right Eye Distance: 20/25 (uncorrected - wears prescription glasses) Left Eye Distance: 20/25 (uncorrected - wears prescription glasses) Bilateral Distance: 20/30 (uncorrected - wears prescription glasses)  Right Eye Near:   Left Eye Near:    Bilateral Near:     Physical Exam Vitals and nursing note reviewed.  Constitutional:      General: She is not in acute distress.    Appearance: Normal appearance. She is not ill-appearing.  HENT:     Head: Normocephalic and atraumatic.   Eyes:     Extraocular Movements: Extraocular movements intact.     Conjunctiva/sclera: Conjunctivae normal.     Pupils: Pupils are equal, round, and reactive to light.     Comments: Mild swelling appreciated to the left inner upper lid   Cardiovascular:     Rate and Rhythm: Normal rate.  Pulmonary:     Effort: Pulmonary effort is normal.   Neurological:     Mental Status: She is alert.   Psychiatric:        Mood and Affect: Mood normal.        Behavior: Behavior normal.        Thought Content: Thought content normal.      UC Treatments / Results  Labs (all labs ordered are listed, but only abnormal results are displayed) Labs Reviewed - No data to display  EKG   Radiology No results found.  Procedures Procedures (including critical care time)  Medications Ordered in UC Medications - No data to display  Initial Impression / Assessment and Plan / UC Course  I have reviewed the triage vital signs and the nursing notes.  Pertinent labs & imaging results that were available during my care of the patient were reviewed by me and considered in my medical decision making (see chart  for details).    I suspect the symptoms are most likely related to hordeolum but will treat with antibiotics given continued swelling.  Antibiotic drops as well as oral antibiotics prescribed.  Patient reports that after antibiotic therapy she will have yeast infections develop, Diflucan  prescribed for same.  Encouraged follow-up if no gradual improvement or with any further concerns.  Advised emergency room evaluation with any worsening symptoms.  Final Clinical Impressions(s) / UC Diagnoses   Final diagnoses:  Hordeolum externum of left upper eyelid  Pain of left eye   Discharge Instructions   None    ED Prescriptions     Medication Sig Dispense Auth. Provider   trimethoprim -polymyxin b  (POLYTRIM ) ophthalmic solution Place 1 drop into the left eye every 4 (four) hours for 7 days. 10 mL Billy Asberry FALCON, PA-C   doxycycline  (VIBRAMYCIN ) 100 MG capsule Take 1 capsule (100 mg total) by mouth 2 (two) times daily for 7 days. 14 capsule Billy Asberry F, PA-C   fluconazole  (DIFLUCAN ) 150 MG tablet Take one tab PO today and repeat dose in 3 days if symptoms persist 2 tablet Billy Asberry FALCON, PA-C      PDMP not reviewed this encounter.   Billy Asberry FALCON, PA-C 07/19/23 1255

## 2023-07-23 ENCOUNTER — Other Ambulatory Visit: Payer: Self-pay | Admitting: Nurse Practitioner

## 2023-07-23 DIAGNOSIS — E119 Type 2 diabetes mellitus without complications: Secondary | ICD-10-CM

## 2023-07-28 ENCOUNTER — Ambulatory Visit: Admitting: Nurse Practitioner

## 2023-07-28 ENCOUNTER — Encounter: Payer: Self-pay | Admitting: Nurse Practitioner

## 2023-07-28 VITALS — BP 134/82 | HR 104 | Temp 97.4°F | Ht 61.0 in | Wt 227.4 lb

## 2023-07-28 DIAGNOSIS — E119 Type 2 diabetes mellitus without complications: Secondary | ICD-10-CM | POA: Diagnosis not present

## 2023-07-28 DIAGNOSIS — Z7985 Long-term (current) use of injectable non-insulin antidiabetic drugs: Secondary | ICD-10-CM | POA: Insufficient documentation

## 2023-07-28 DIAGNOSIS — G8929 Other chronic pain: Secondary | ICD-10-CM

## 2023-07-28 DIAGNOSIS — M5441 Lumbago with sciatica, right side: Secondary | ICD-10-CM | POA: Diagnosis not present

## 2023-07-28 DIAGNOSIS — L309 Dermatitis, unspecified: Secondary | ICD-10-CM

## 2023-07-28 LAB — MICROALBUMIN / CREATININE URINE RATIO
Creatinine,U: 226.3 mg/dL
Microalb Creat Ratio: 4.5 mg/g (ref 0.0–30.0)
Microalb, Ur: 1 mg/dL (ref 0.0–1.9)

## 2023-07-28 MED ORDER — TRIAMCINOLONE ACETONIDE 0.1 % EX CREA: 1.0000 | TOPICAL_CREAM | Freq: Two times a day (BID) | CUTANEOUS | 2 refills | Status: DC

## 2023-07-28 MED ORDER — SEMAGLUTIDE (1 MG/DOSE) 4 MG/3ML ~~LOC~~ SOPN: 1.0000 mg | PEN_INJECTOR | SUBCUTANEOUS | 2 refills | Status: DC

## 2023-07-28 NOTE — Assessment & Plan Note (Signed)
 Flares occur in summer, and she uses triamcinolone cream frequently. Refill triamcinolone cream in a jar.

## 2023-07-28 NOTE — Assessment & Plan Note (Signed)
 BMI 42.9. weight has been stable. Will increase ozempic  to 1mg  weekly. Discussed increasing protein. Follow-up in 3 months.

## 2023-07-28 NOTE — Assessment & Plan Note (Signed)
 Increase ozempic to 1 mg weekly

## 2023-07-28 NOTE — Assessment & Plan Note (Signed)
 Chronic, stable. Continue gabapentin  300mg  BID and stretches daily.

## 2023-07-28 NOTE — Progress Notes (Signed)
 Established Patient Office Visit  Subjective   Patient ID: Karen Jensen, female    DOB: 09-15-1985  Age: 38 y.o. MRN: [id number]  Chief Complaint  Patient presents with   Diabetes    Follow up, concerns slow weight loss    HPI  Discussed the use of AI scribe software for clinical note transcription with the patient, who gave verbal consent to proceed.  History of Present Illness   Karen Jensen is a 38 year old female who presents for follow-up on diabetes and weight management and medication review.  She experiences slow but steady weight loss with a decreased appetite since increasing ozempic . She consumes small meal portions and prefers fruits like cantaloupe, honey melon, grapes, pineapples, and peaches, avoiding bananas. No side effects from the increased dose of ozempic , including constipation or nausea. She has reduced her apple juice intake.  Her sugars have been well controlled. She denies chest pain and shortness of breath.        ROS See pertinent positives and negatives per HPI.    Objective:     BP 134/82 (BP Location: Left Arm, Patient Position: Sitting, Cuff Size: Normal)   Pulse (!) 104   Temp (!) 97.4 F (36.3 C)   Ht 5' 1 (1.549 m)   Wt 227 lb 6.4 oz (103.1 kg)   LMP 07/24/2023 (Approximate)   SpO2 98%   BMI 42.97 kg/m  BP Readings from Last 3 Encounters:  07/28/23 134/82  07/16/23 (!) 140/96  05/28/23 122/82   Wt Readings from Last 3 Encounters:  07/28/23 227 lb 6.4 oz (103.1 kg)  05/28/23 227 lb (103 kg)  05/26/23 227 lb 6.4 oz (103.1 kg)      Physical Exam Vitals and nursing note reviewed.  Constitutional:      General: She is not in acute distress.    Appearance: Normal appearance.  HENT:     Head: Normocephalic.  Eyes:     Conjunctiva/sclera: Conjunctivae normal.  Cardiovascular:     Rate and Rhythm: Normal rate and regular rhythm.     Pulses: Normal pulses.     Heart sounds: Normal heart sounds.   Pulmonary:     Effort: Pulmonary effort is normal.     Breath sounds: Normal breath sounds.  Musculoskeletal:     Cervical back: Normal range of motion.  Skin:    General: Skin is warm.  Neurological:     General: No focal deficit present.     Mental Status: She is alert and oriented to person, place, and time.  Psychiatric:        Mood and Affect: Mood normal.        Behavior: Behavior normal.        Thought Content: Thought content normal.        Judgment: Judgment normal.    Diabetic Foot Exam - Simple   Simple Foot Form Visual Inspection No deformities, no ulcerations, no other skin breakdown bilaterally: Yes Sensation Testing Intact to touch and monofilament testing bilaterally: Yes Pulse Check Posterior Tibialis and Dorsalis pulse intact bilaterally: Yes Comments       Assessment & Plan:   Problem List Items Addressed This Visit       Endocrine   Diabetes mellitus without complication (HCC) - Primary   Chronic, stable managed with Ozempic  for appetite and weight control, and Lisinopril  5mg  daily for renal protection. Will increase ozempic  to 1mg  weekly. Recommend an annual ophthalmologic exam and monitor skin for lesions  and healing. Encourage a balanced diet with adequate protein and calories. Follow-up in 3 months.       Relevant Medications   Semaglutide , 1 MG/DOSE, 4 MG/3ML SOPN   Other Relevant Orders   Microalbumin / creatinine urine ratio     Nervous and Auditory   Chronic right-sided low back pain with right-sided sciatica   Chronic, stable. Continue gabapentin  300mg  BID and stretches daily.         Musculoskeletal and Integument   Eczema   Flares occur in summer, and she uses triamcinolone  cream frequently. Refill triamcinolone  cream in a jar.       Relevant Medications   triamcinolone  cream (KENALOG ) 0.1 %     Other   Morbid obesity (HCC)   BMI 42.9. weight has been stable. Will increase ozempic  to 1mg  weekly. Discussed increasing  protein. Follow-up in 3 months.       Relevant Medications   Semaglutide , 1 MG/DOSE, 4 MG/3ML SOPN   Long-term current use of injectable noninsulin antidiabetic medication   Increase ozempic  to 1mg  weekly.        Return in about 3 months (around 10/28/2023) for Diabetes.    Tinnie DELENA Harada, NP

## 2023-07-28 NOTE — Patient Instructions (Signed)
 It was great to see you!  Increase your ozempic  to 1mg  weekly   Add some protein into your diet  Let's follow-up in 3 months, sooner if you have concerns.  If a referral was placed today, you will be contacted for an appointment. Please note that routine referrals can sometimes take up to 3-4 weeks to process. Please call our office if you haven't heard anything after this time frame.  Take care,  Tinnie Harada, NP

## 2023-07-28 NOTE — Assessment & Plan Note (Signed)
 Chronic, stable managed with Ozempic  for appetite and weight control, and Lisinopril  5mg  daily for renal protection. Will increase ozempic  to 1mg  weekly. Recommend an annual ophthalmologic exam and monitor skin for lesions and healing. Encourage a balanced diet with adequate protein and calories. Follow-up in 3 months.

## 2023-07-29 ENCOUNTER — Ambulatory Visit: Payer: Self-pay | Admitting: Nurse Practitioner

## 2023-07-30 ENCOUNTER — Other Ambulatory Visit: Payer: Self-pay | Admitting: Nurse Practitioner

## 2023-07-30 DIAGNOSIS — L309 Dermatitis, unspecified: Secondary | ICD-10-CM

## 2023-07-30 MED ORDER — TRIAMCINOLONE ACETONIDE 0.1 % EX CREA: 1.0000 | TOPICAL_CREAM | Freq: Two times a day (BID) | CUTANEOUS | 2 refills | Status: AC

## 2023-07-30 MED ORDER — SEMAGLUTIDE (1 MG/DOSE) 4 MG/3ML ~~LOC~~ SOPN: 1.0000 mg | PEN_INJECTOR | SUBCUTANEOUS | 2 refills | Status: DC

## 2023-07-30 NOTE — Telephone Encounter (Signed)
 Copied from CRM [id number]. Topic: Clinical - Prescription Issue >> Jul 30, 2023  1:38 PM Karen Jensen wrote: Reason for CRM: Pt would like triamcinolone  cream (KENALOG ) 0.1 % transferred to  [id number] [place], [place] - [address] [place] [address] Phone: [phone] Fax: [phone] Hours: Not open 24 hours

## 2023-07-30 NOTE — Telephone Encounter (Signed)
 Copied from CRM [id number]. Topic: Clinical - Medication Refill >> Jul 30, 2023  1:35 PM Ernestene P wrote: Medication: Semaglutide , 1 MG/DOSE, 4 MG/3ML SOPN  Has the patient contacted their pharmacy? Yes (Agent: If no, request that the patient contact the pharmacy for the refill. If patient does not wish to contact the pharmacy document the reason why and proceed with request.) (Agent: If yes, when and what did the pharmacy advise?)  This is the patient's preferred pharmacy:  [id number] - [place],  - [address]   Is this the correct pharmacy for this prescription? Yes If no, delete pharmacy and type the correct one.   Has the prescription been filled recently? No  Is the patient out of the medication? Yes  Has the patient been seen for an appointment in the last year OR does the patient have an upcoming appointment? Yes  Can we respond through MyChart? Yes  Agent: Please be advised that Rx refills may take up to 3 business days. We ask that you follow-up with your pharmacy.

## 2023-07-30 NOTE — Telephone Encounter (Signed)
 I called and spoke with patient and she is requesting the Semaglutide  and triamcinolone  cream to be sent to [facility] [address] due to issues she is having with [facility].  [facility].

## 2023-10-28 ENCOUNTER — Encounter: Payer: Self-pay | Admitting: Nurse Practitioner

## 2023-10-28 ENCOUNTER — Ambulatory Visit: Payer: Self-pay | Admitting: Nurse Practitioner

## 2023-10-28 ENCOUNTER — Ambulatory Visit: Admitting: Nurse Practitioner

## 2023-10-28 VITALS — BP 134/78 | HR 94 | Temp 97.6°F | Ht 61.0 in | Wt 216.4 lb

## 2023-10-28 DIAGNOSIS — G8929 Other chronic pain: Secondary | ICD-10-CM

## 2023-10-28 DIAGNOSIS — Z7985 Long-term (current) use of injectable non-insulin antidiabetic drugs: Secondary | ICD-10-CM

## 2023-10-28 DIAGNOSIS — E119 Type 2 diabetes mellitus without complications: Secondary | ICD-10-CM

## 2023-10-28 DIAGNOSIS — M5441 Lumbago with sciatica, right side: Secondary | ICD-10-CM

## 2023-10-28 DIAGNOSIS — J45909 Unspecified asthma, uncomplicated: Secondary | ICD-10-CM

## 2023-10-28 LAB — CBC WITH DIFFERENTIAL/PLATELET
Basophils Absolute: 0 K/uL (ref 0.0–0.1)
Basophils Relative: 0.3 % (ref 0.0–3.0)
Eosinophils Absolute: 0.2 K/uL (ref 0.0–0.7)
Eosinophils Relative: 1.8 % (ref 0.0–5.0)
HCT: 40.7 % (ref 36.0–46.0)
Hemoglobin: 13.9 g/dL (ref 12.0–15.0)
Lymphocytes Relative: 35.6 % (ref 12.0–46.0)
Lymphs Abs: 3.9 K/uL (ref 0.7–4.0)
MCHC: 34.1 g/dL (ref 30.0–36.0)
MCV: 88.7 fl (ref 78.0–100.0)
Monocytes Absolute: 0.7 K/uL (ref 0.1–1.0)
Monocytes Relative: 6.3 % (ref 3.0–12.0)
Neutro Abs: 6.1 K/uL (ref 1.4–7.7)
Neutrophils Relative %: 56 % (ref 43.0–77.0)
Platelets: 329 K/uL (ref 150.0–400.0)
RBC: 4.59 Mil/uL (ref 3.87–5.11)
RDW: 13.5 % (ref 11.5–15.5)
WBC: 10.8 K/uL — ABNORMAL HIGH (ref 4.0–10.5)

## 2023-10-28 LAB — BASIC METABOLIC PANEL WITH GFR
BUN: 11 mg/dL (ref 6–23)
CO2: 24 meq/L (ref 19–32)
Calcium: 9.6 mg/dL (ref 8.4–10.5)
Chloride: 107 meq/L (ref 96–112)
Creatinine, Ser: 0.85 mg/dL (ref 0.40–1.20)
GFR: 88.09 mL/min (ref >60.00)
Glucose, Bld: 110 mg/dL — ABNORMAL HIGH (ref 70–99)
Potassium: 3.5 meq/L (ref 3.5–5.1)
Sodium: 140 meq/L (ref 135–145)

## 2023-10-28 LAB — HEMOGLOBIN A1C: Hgb A1c MFr Bld: 5.9 % (ref 4.6–6.5)

## 2023-10-28 MED ORDER — LISINOPRIL 5 MG PO TABS: 5.0000 mg | ORAL_TABLET | Freq: Every day | ORAL | 1 refills | Status: AC

## 2023-10-28 MED ORDER — TIZANIDINE HCL 4 MG PO TABS: 4.0000 mg | ORAL_TABLET | Freq: Three times a day (TID) | ORAL | 0 refills | Status: AC | PRN

## 2023-10-28 MED ORDER — SEMAGLUTIDE (2 MG/DOSE) 8 MG/3ML ~~LOC~~ SOPN: 2.0000 mg | PEN_INJECTOR | SUBCUTANEOUS | 1 refills | Status: AC

## 2023-10-28 NOTE — Progress Notes (Signed)
 Established Patient Office Visit  Subjective   Patient ID: NAKYRA BOURN, female    DOB: 06-Feb-1985  Age: 38 y.o. MRN: [id number]  Chief Complaint  Patient presents with   Diabetes    Follow up, sciatic pain, Rx Refills    HPI Discussed the use of AI scribe software for clinical note transcription with the patient, who gave verbal consent to proceed.  History of Present Illness   Karen Jensen is a 38 year old female with diabetes and asthma who presents for a follow-up visit.  She has not been checking her blood sugars recently but believes she is well-controlled. She has lost 11 pounds by focusing on nutrition, including protein shakes, vegetables, and fruits. She is on Ozempic  without side effects and takes lisinopril  for blood pressure and kidney protection, though she lost her medication recently.  Intermittent back pain persists, and gabapentin  does not always alleviate it. She requests a muscle relaxer, which has been effective in the past.  Her asthma is generally well-controlled with infrequent attacks, one triggered by strong cologne and seasonal changes. She uses her steroid inhaler twice daily and rarely needs albuterol .  No numbness or tingling in her feet. She recently moved to accommodate her mother and brother.       ROS See pertinent positives and negatives per HPI.    Objective:     BP 134/78 (BP Location: Left Arm, Patient Position: Sitting, Cuff Size: Large)   Pulse 94   Temp 97.6 F (36.4 C)   Ht 5' 1 (1.549 m)   Wt 216 lb 6.4 oz (98.2 kg)   LMP 10/23/2023 (Exact Date)   SpO2 98%   BMI 40.89 kg/m  BP Readings from Last 3 Encounters:  10/28/23 134/78  07/28/23 134/82  07/16/23 (!) 140/96   Wt Readings from Last 3 Encounters:  10/28/23 216 lb 6.4 oz (98.2 kg)  07/28/23 227 lb 6.4 oz (103.1 kg)  05/28/23 227 lb (103 kg)      Physical Exam Vitals and nursing note reviewed.  Constitutional:      General: She is not in  acute distress.    Appearance: Normal appearance.  HENT:     Head: Normocephalic.  Eyes:     Conjunctiva/sclera: Conjunctivae normal.  Cardiovascular:     Rate and Rhythm: Normal rate and regular rhythm.     Pulses: Normal pulses.     Heart sounds: Normal heart sounds.  Pulmonary:     Effort: Pulmonary effort is normal.     Breath sounds: Normal breath sounds.  Musculoskeletal:     Cervical back: Normal range of motion.  Skin:    General: Skin is warm.  Neurological:     General: No focal deficit present.     Mental Status: She is alert and oriented to person, place, and time.  Psychiatric:        Mood and Affect: Mood normal.        Behavior: Behavior normal.        Thought Content: Thought content normal.        Judgment: Judgment normal.    Diabetic Foot Exam - Simple   Simple Foot Form Visual Inspection No deformities, no ulcerations, no other skin breakdown bilaterally: Yes Sensation Testing Intact to touch and monofilament testing bilaterally: Yes Pulse Check Posterior Tibialis and Dorsalis pulse intact bilaterally: Yes Comments      Assessment & Plan:   Problem List Items Addressed This Visit  Respiratory   Asthma, chronic, unspecified asthma severity, uncomplicated   Asthma is well-managed with infrequent attacks, and she rarely needs albuterol . Continue wixela inhaler twice daily and use albuterol  as needed.        Endocrine   Diabetes mellitus without complication (HCC) - Primary   Chronic, stable. Increase Ozempic  to 2 mg for enhanced weight management. Check BMP, CBC, A1c today. Request recent eye exam. Foot exam normal today. Follow-up in 6 months. Refill lisinopril  5mg  daily for kidney protection.       Relevant Medications   lisinopril  (ZESTRIL ) 5 MG tablet   Semaglutide , 2 MG/DOSE, 8 MG/3ML SOPN   Other Relevant Orders   Basic metabolic panel with GFR (Completed)   CBC with Differential/Platelet (Completed)   Hemoglobin A1c (Completed)      Nervous and Auditory   Chronic right-sided low back pain with right-sided sciatica   Chronic back pain is intermittently problematic. Gabapentin  is not fully effective for certain episodes.  Will add on tizanidine  4 mg TID prn muscle spasms and continue gabapentin  300mg  BID.       Relevant Medications   tiZANidine  (ZANAFLEX ) 4 MG tablet     Other   Long-term current use of injectable noninsulin antidiabetic medication   Increase ozempic  to 2mg  weekly        Return in about 6 months (around 04/27/2024) for CPE.    Tinnie DELENA Harada, NP

## 2023-10-28 NOTE — Assessment & Plan Note (Signed)
 Chronic back pain is intermittently problematic. Gabapentin  is not fully effective for certain episodes.  Will add on tizanidine  4 mg TID prn muscle spasms and continue gabapentin  300mg  BID.

## 2023-10-28 NOTE — Assessment & Plan Note (Signed)
 Chronic, stable. Increase Ozempic  to 2 mg for enhanced weight management. Check BMP, CBC, A1c today. Request recent eye exam. Foot exam normal today. Follow-up in 6 months. Refill lisinopril  5mg  daily for kidney protection.

## 2023-10-28 NOTE — Assessment & Plan Note (Signed)
 Asthma is well-managed with infrequent attacks, and she rarely needs albuterol . Continue wixela inhaler twice daily and use albuterol  as needed.

## 2023-10-28 NOTE — Assessment & Plan Note (Signed)
Increase ozempic to 2 mg weekly 

## 2023-10-28 NOTE — Patient Instructions (Signed)
 It was great to see you!  We are checking your labs today and will let you know the results via mychart/phone.   I have refilled your medications  Increase ozempic  to 2mg  weekly   Let's follow-up in 6 months, sooner if you have concerns.  If a referral was placed today, you will be contacted for an appointment. Please note that routine referrals can sometimes take up to 3-4 weeks to process. Please call our office if you haven't heard anything after this time frame.  Take care,  Tinnie Harada, NP

## 2023-12-15 ENCOUNTER — Telehealth: Payer: Self-pay

## 2023-12-15 NOTE — Telephone Encounter (Signed)
 Medical Records Request for Eye Exam

## 2024-04-27 ENCOUNTER — Encounter: Admitting: Nurse Practitioner
